# Patient Record
Sex: Male | Born: 1965
Health system: Southern US, Community
[De-identification: ages and names within clinical notes are randomized; demographics above are authoritative.]

## PROBLEM LIST (undated history)

## (undated) DIAGNOSIS — T7840XA Allergy, unspecified, initial encounter: Secondary | ICD-10-CM

## (undated) DIAGNOSIS — E785 Hyperlipidemia, unspecified: Secondary | ICD-10-CM

## (undated) DIAGNOSIS — N200 Calculus of kidney: Secondary | ICD-10-CM

## (undated) DIAGNOSIS — K219 Gastro-esophageal reflux disease without esophagitis: Secondary | ICD-10-CM

## (undated) DIAGNOSIS — E669 Obesity, unspecified: Secondary | ICD-10-CM

## (undated) DIAGNOSIS — J189 Pneumonia, unspecified organism: Secondary | ICD-10-CM

## (undated) DIAGNOSIS — L719 Rosacea, unspecified: Secondary | ICD-10-CM

## (undated) DIAGNOSIS — G43909 Migraine, unspecified, not intractable, without status migrainosus: Secondary | ICD-10-CM

## (undated) DIAGNOSIS — M722 Plantar fascial fibromatosis: Secondary | ICD-10-CM

## (undated) DIAGNOSIS — I10 Essential (primary) hypertension: Secondary | ICD-10-CM

## (undated) HISTORY — DX: Calculus of kidney: N20.0

## (undated) HISTORY — DX: Plantar fascial fibromatosis: M72.2

## (undated) HISTORY — DX: Rosacea, unspecified: L71.9

## (undated) HISTORY — PX: OTHER SURGICAL HISTORY: SHX169

## (undated) HISTORY — DX: Allergy, unspecified, initial encounter: T78.40XA

## (undated) HISTORY — DX: Obesity, unspecified: E66.9

## (undated) HISTORY — PX: WISDOM TOOTH EXTRACTION: SHX21

## (undated) HISTORY — DX: Migraine, unspecified, not intractable, without status migrainosus: G43.909

## (undated) HISTORY — DX: Hyperlipidemia, unspecified: E78.5

## (undated) HISTORY — PX: VASECTOMY: SHX75

---

## 2010-01-13 ENCOUNTER — Emergency Department (HOSPITAL_COMMUNITY): Admission: EM | Admit: 2010-01-13 | Discharge: 2010-01-13 | Payer: Self-pay | Admitting: Family Medicine

## 2010-11-27 ENCOUNTER — Other Ambulatory Visit: Payer: Self-pay | Admitting: Internal Medicine

## 2010-11-29 ENCOUNTER — Ambulatory Visit
Admission: RE | Admit: 2010-11-29 | Discharge: 2010-11-29 | Disposition: A | Discharge status: Home / Self Care | Payer: Commercial Managed Care - PPO | Source: Ambulatory Visit | Attending: Internal Medicine | Admitting: Internal Medicine

## 2012-08-20 ENCOUNTER — Emergency Department (HOSPITAL_COMMUNITY)
Admission: EM | Admit: 2012-08-20 | Discharge: 2012-08-20 | Disposition: A | Discharge status: Home / Self Care | Payer: 59 | Source: Home / Self Care | Attending: Family Medicine | Admitting: Family Medicine

## 2012-08-20 ENCOUNTER — Encounter (HOSPITAL_COMMUNITY): Payer: Self-pay | Admitting: Emergency Medicine

## 2012-08-20 DIAGNOSIS — H11429 Conjunctival edema, unspecified eye: Secondary | ICD-10-CM

## 2012-08-20 HISTORY — DX: Essential (primary) hypertension: I10

## 2012-08-20 MED ORDER — KETOROLAC TROMETHAMINE 0.5 % OP SOLN: 1.0000 [drp] | Freq: Three times a day (TID) | OPHTHALMIC | Status: DC | PRN

## 2012-08-20 MED ORDER — TETRACAINE HCL 0.5 % OP SOLN
OPHTHALMIC | Status: AC
  Filled 2012-08-20: qty 2

## 2012-08-20 MED ORDER — ERYTHROMYCIN 5 MG/GM OP OINT: TOPICAL_OINTMENT | OPHTHALMIC | Status: DC

## 2012-08-20 NOTE — ED Notes (Signed)
Pt c/o injury to right eye. Pt states that he was working on a car and something fell into his right eye. At that time he felt a sharp pain that came and went. Since then he has had red streaking under eye and ? Blister in corner of right eye.  There is some puffiness and drainage from right eye. Pain has subsided.   Incident happened three wks ago.

## 2012-08-20 NOTE — ED Notes (Signed)
Waiting discharge papers 

## 2012-08-24 NOTE — ED Provider Notes (Signed)
History     CSN: [id number]  Arrival date & time 08/20/12  4098   First MD Initiated Contact with Patient 08/20/12 1010      Chief Complaint  Patient presents with  . Eye Injury    (Consider location/radiation/quality/duration/timing/severity/associated sxs/prior treatment) HPI Comments: 46 y/o male with h/o hypertension comes c/o right eye drainage and a blister in corner of the eye. Denies eye pain, itchiness or visual changes. Patient reports he had a foreign body falling inside his right eye while working under a car 3 weeks ago. Felt a sharp pain at the time that quickly resolved, subsequently developed a red spot that looked like a clot and a blister in the inner corner of the eye that is still present. Denies current pain but reports intermittent thin clear discharge that is thicker in am. No pain with eye movement.    Past Medical History  Diagnosis Date  . Hypertension     History reviewed. No pertinent past surgical history.  History reviewed. No pertinent family history.  History  Substance Use Topics  . Smoking status: Never Smoker   . Smokeless tobacco: Not on file  . Alcohol Use: Yes     occasional      Review of Systems  Eyes: Positive for discharge and redness. Negative for photophobia, pain, itching and visual disturbance.       As per HPI  Neurological: Negative for headaches.  All other systems reviewed and are negative.    Allergies  Review of patient's allergies indicates no known allergies.  Home Medications   Current Outpatient Rx  Name Route Sig Dispense Refill  . BENICAR PO Oral Take 10 mg by mouth.    . ERYTHROMYCIN 5 MG/GM OP OINT  Place a 1/2 inch ribbon of ointment into the righ lower eyelid every 6 hours for 7 days 1 g 0  . KETOROLAC TROMETHAMINE 0.5 % OP SOLN Right Eye Place 1 drop into the right eye 3 (three) times daily as needed. 3 mL 0    There were no vitals taken for this visit.  Physical Exam  Nursing note and vitals  reviewed. Constitutional: He is oriented to person, place, and time. He appears well-developed and well-nourished. No distress.  HENT:  Head: Normocephalic and atraumatic.  Eyes: EOM are normal. Pupils are equal, round, and reactive to light. Right conjunctiva is injected. No scleral icterus. Right eye exhibits normal extraocular motion and no nystagmus.         Right eye:  Upper lid everted and observed normal inner conjunctiva and no foreign body. No obvious hyphema or hypopion Ocular globe with mild conjunctival injection. There is focal swelling and chemosis at the nasal corner of the conjunctiva. No ulcerations, pooling or obvious foreign body on fluorescein exam. Clear tearing.   Neck: Neck supple.  Cardiovascular: Normal heart sounds.   Pulmonary/Chest: Breath sounds normal.  Lymphadenopathy:    He has no cervical adenopathy.  Neurological: He is alert and oriented to person, place, and time.  Skin: He is not diaphoretic.    ED Course  Procedures (including critical care time)  Labs Reviewed - No data to display No results found.   1. Chemosis of conjunctiva subconjunctival edema       MDM  Prescribed erythromycin ointment and ketorolac drops. Asked to follow up with eye specialist to assure no deep embedded foreign body.  ophthalmologist contact information provided.         Sharin Grave, MD 08/24/12 [phone]

## 2014-07-21 ENCOUNTER — Ambulatory Visit (INDEPENDENT_AMBULATORY_CARE_PROVIDER_SITE_OTHER): Payer: Self-pay | Admitting: General Surgery

## 2014-09-01 NOTE — Progress Notes (Signed)
Please put orders in Epic surgery 09-16-14 pre op 09-12-14 Thanks

## 2014-09-02 ENCOUNTER — Other Ambulatory Visit (INDEPENDENT_AMBULATORY_CARE_PROVIDER_SITE_OTHER): Payer: Self-pay | Admitting: General Surgery

## 2014-09-09 ENCOUNTER — Other Ambulatory Visit (HOSPITAL_COMMUNITY): Payer: Self-pay | Admitting: *Deleted

## 2014-09-12 ENCOUNTER — Encounter (HOSPITAL_COMMUNITY): Payer: Self-pay

## 2014-09-12 ENCOUNTER — Encounter (HOSPITAL_COMMUNITY)
Admission: RE | Admit: 2014-09-12 | Discharge: 2014-09-12 | Disposition: A | Discharge status: Home / Self Care | Payer: BC Managed Care – PPO | Source: Ambulatory Visit | Attending: General Surgery | Admitting: General Surgery

## 2014-09-12 ENCOUNTER — Ambulatory Visit (HOSPITAL_COMMUNITY)
Admission: RE | Admit: 2014-09-12 | Discharge: 2014-09-12 | Disposition: A | Discharge status: Home / Self Care | Payer: BC Managed Care – PPO | Source: Ambulatory Visit | Attending: Anesthesiology | Admitting: Anesthesiology

## 2014-09-12 DIAGNOSIS — I10 Essential (primary) hypertension: Secondary | ICD-10-CM

## 2014-09-12 DIAGNOSIS — K469 Unspecified abdominal hernia without obstruction or gangrene: Secondary | ICD-10-CM | POA: Insufficient documentation

## 2014-09-12 DIAGNOSIS — Z01818 Encounter for other preprocedural examination: Secondary | ICD-10-CM | POA: Diagnosis present

## 2014-09-12 HISTORY — DX: Gastro-esophageal reflux disease without esophagitis: K21.9

## 2014-09-12 HISTORY — DX: Pneumonia, unspecified organism: J18.9

## 2014-09-12 LAB — COMPREHENSIVE METABOLIC PANEL
ALK PHOS: 85 U/L (ref 39–117)
ALT: 27 U/L (ref 0–53)
ANION GAP: 11 (ref 5–15)
AST: 35 U/L (ref 0–37)
Albumin: 4 g/dL (ref 3.5–5.2)
BUN: 16 mg/dL (ref 6–23)
CALCIUM: 9.3 mg/dL (ref 8.4–10.5)
CO2: 27 mEq/L (ref 19–32)
CREATININE: 0.87 mg/dL (ref 0.50–1.35)
Chloride: 104 mEq/L (ref 96–112)
GFR calc non Af Amer: >90 mL/min (ref >90)
GLUCOSE: 99 mg/dL (ref 70–99)
Potassium: 4.4 mEq/L (ref 3.7–5.3)
Sodium: 142 mEq/L (ref 137–147)
TOTAL PROTEIN: 7.6 g/dL (ref 6.0–8.3)
Total Bilirubin: 0.6 mg/dL (ref 0.3–1.2)

## 2014-09-12 LAB — CBC WITH DIFFERENTIAL/PLATELET
Basophils Absolute: 0 10*3/uL (ref 0.0–0.1)
Basophils Relative: 0 % (ref 0–1)
EOS ABS: 0.1 10*3/uL (ref 0.0–0.7)
EOS PCT: 1 % (ref 0–5)
HCT: 41.3 % (ref 39.0–52.0)
HEMOGLOBIN: 14.7 g/dL (ref 13.0–17.0)
Lymphocytes Relative: 23 % (ref 12–46)
Lymphs Abs: 1.6 10*3/uL (ref 0.7–4.0)
MCH: 32.7 pg (ref 26.0–34.0)
MCHC: 35.6 g/dL (ref 30.0–36.0)
MCV: 91.8 fL (ref 78.0–100.0)
MONO ABS: 0.4 10*3/uL (ref 0.1–1.0)
MONOS PCT: 6 % (ref 3–12)
Neutro Abs: 4.8 10*3/uL (ref 1.7–7.7)
Neutrophils Relative %: 70 % (ref 43–77)
Platelets: 187 10*3/uL (ref 150–400)
RBC: 4.5 MIL/uL (ref 4.22–5.81)
RDW: 12.3 % (ref 11.5–15.5)
WBC: 7 10*3/uL (ref 4.0–10.5)

## 2014-09-12 NOTE — Progress Notes (Signed)
EKG per chart from 11/27/2010 per Dr Keane Police office

## 2014-09-12 NOTE — Patient Instructions (Signed)
[place]  09/12/2014   Your procedure is scheduled on:     Friday November 20,2015  Report to [facility] Main Entrance and follow signs to  [place] at [phone] AM.   Call this number if you have problems the morning of surgery [phone] or Presurgical Testing [phone].   Remember:  Do not eat food or drink liquids :After Midnight.  For Living Will and/or Health Care Power Attorney Forms: please provide copy for your medical record, may bring AM of surgery (forms should be already notarized-we do not provide this service).      Take these medicines the morning of surgery with A SIP OF WATER: Loratadine(Claritin) if needed.                               You may not have any metal on your body including hair pins and piercings  Do not wear jewelry, lotions, powders, or deodorant.  Men may shave face and neck.               Do not bring valuables to the hospital. [place].  Contacts, dentures or bridgework may not be worn into surgery.  Leave suitcase in the car. After surgery it may be brought to your room.  For patients admitted to the hospital, checkout time is 11:00 AM the day of discharge.   ________________________________________________________________________  [facility] - Preparing for Surgery Before surgery, you can play an important role.  Because skin is not sterile, your skin needs to be as free of germs as possible.  You can reduce the number of germs on your skin by washing with CHG (chlorahexidine gluconate) soap before surgery.  CHG is an antiseptic cleaner which kills germs and bonds with the skin to continue killing germs even after washing. Please DO NOT use if you have an allergy to CHG or antibacterial soaps.  If your skin becomes reddened/irritated stop using the CHG and inform your nurse when you arrive at Short Stay. Do not shave (including legs and underarms) for at least 48 hours prior  to the first CHG shower.  You may shave your face/neck. Please follow these instructions carefully:  1.  Shower with CHG Soap the night before surgery and the  morning of Surgery.  2.  If you choose to wash your hair, wash your hair first as usual with your  normal  shampoo.  3.  After you shampoo, rinse your hair and body thoroughly to remove the  shampoo.                           4.  Use CHG as you would any other liquid soap.  You can apply chg directly  to the skin and wash                       Gently with a scrungie or clean washcloth.  5.  Apply the CHG Soap to your body ONLY FROM THE NECK DOWN.   Do not use on face/ open                           Wound or open sores. Avoid contact with eyes, ears mouth and genitals (private parts).  Wash face,  Genitals (private parts) with your normal soap.             6.  Wash thoroughly, paying special attention to the area where your surgery  will be performed.  7.  Thoroughly rinse your body with warm water from the neck down.  8.  DO NOT shower/wash with your normal soap after using and rinsing off  the CHG Soap.                9.  Pat yourself dry with a clean towel.            10.  Wear clean pajamas.            11.  Place clean sheets on your bed the night of your first shower and do not  sleep with pets. Day of Surgery : Do not apply any lotions/deodorants the morning of surgery.  Please wear clean clothes to the hospital/surgery center.  FAILURE TO FOLLOW THESE INSTRUCTIONS MAY RESULT IN THE CANCELLATION OF YOUR SURGERY PATIENT SIGNATURE_________________________________  NURSE SIGNATURE__________________________________  ________________________________________________________________________

## 2014-09-12 NOTE — Progress Notes (Signed)
Your patient has screened at an elevated risk for Obstructive Sleep Apnea using the Stop-Bang Tool during a pre-surgical vist. A score of 4 or greater is an elevated risk. Score of 5.  

## 2014-09-15 MED ORDER — DEXTROSE 5 % IV SOLN
3.0000 g | INTRAVENOUS | Status: AC
  Administered 2014-09-16: 3 g via INTRAVENOUS
  Filled 2014-09-15: qty 3000

## 2014-09-15 NOTE — Progress Notes (Signed)
Received phone call from pts wife stating Shuan worked outside over weekend and now has non productive cough, with no fever. States no color noted in nasal secretions.  States called dr Laqueta Linden office and was told to call here so could be documented for anesthesia.  Wife states she was sure PCP would not see him today.  Note under special  needs column of OR schedule.  Instructed wife to have patient notify anesthesia and surgeon in am

## 2014-09-15 NOTE — H&P (Signed)
Robert Mills 07/21/2014 9:03 AM Location: [facility] Patient #: [id number] DOB: 1966-04-28 Married / Language: Robert Mills / Race: White Male  History of Present Illness Robert Hiss [name] MD; 07/21/2014 9:43 AM) Patient words: umb. hernia.  The patient is a 48 year old male who presents with an umbilical hernia. 48 year old Caucasian male referred by Dr. Virgina Mills for evaluation of umbilical hernia. The patient states that he noticed a problem about 4-5 years ago after a coughing and sneezing spell. He felt a tug at that time. Over the years he states that the bulge has gotten larger. It does not interfere with his daily activities. However if he bumps it or if anybody touches the area it does cause a fair amount of pain and discomfort. He denies any fever, chills, nausea, vomiting, diarrhea or constipation. He is trying to lose some weight. He does not smoke. He denies any prior abdominal surgeries. He works as a Research scientist (medical) ([place], [place]; 07/21/2014 9:04 AM) Gastroesophageal Reflux Disease High blood pressure Migraine Headache  Past Surgical History ([place], [place]; 07/21/2014 9:04 AM) Vasectomy  Diagnostic Studies History ([place], [place]; 07/21/2014 9:04 AM) Colonoscopy never  Allergies (Robert Mills; 07/21/2014 9:06 AM) No Known Drug Allergies09/24/2015  Medication History (Robert Mills; 07/21/2014 9:06 AM) Benicar HCT (Oral) Specific dose unknown - Active.  Social History ([place], [place]; 07/21/2014 9:04 AM) Alcohol use Occasional alcohol use. Caffeine use Tea. No drug use Tobacco use Former smoker.  Family History ([place], [place]; 07/21/2014 9:04 AM) Diabetes Mellitus Mother. Melanoma Family Members In General.  Review of Systems (Robert Mills; 07/21/2014 9:04 AM) General Not Present- Appetite Loss, Chills, Fatigue, Fever, Night Sweats,  Weight Gain and Weight Loss. Skin Not Present- Change in Wart/Mole, Dryness, Hives, Jaundice, New Lesions, Non-Healing Wounds, Rash and Ulcer. HEENT Present- Seasonal Allergies and Wears glasses/contact lenses. Not Present- Earache, Hearing Loss, Hoarseness, Nose Bleed, Oral Ulcers, Ringing in the Ears, Sinus Pain, Sore Throat, Visual Disturbances and Yellow Eyes. Respiratory Present- Snoring. Not Present- Bloody sputum, Chronic Cough, Difficulty Breathing and Wheezing. Breast Not Present- Breast Mass, Breast Pain, Nipple Discharge and Skin Changes. Cardiovascular Not Present- Chest Pain, Difficulty Breathing Lying Down, Leg Cramps, Palpitations, Rapid Heart Rate, Shortness of Breath and Swelling of Extremities. Gastrointestinal Present- Indigestion. Not Present- Abdominal Pain, Bloating, Bloody Stool, Change in Bowel Habits, Chronic diarrhea, Constipation, Difficulty Swallowing, Excessive gas, Gets full quickly at meals, Hemorrhoids, Nausea, Rectal Pain and Vomiting. Male Genitourinary Present- Blood in Urine. Not Present- Change in Urinary Stream, Frequency, Impotence, Nocturia, Painful Urination, Urgency and Urine Leakage. Musculoskeletal Present- Joint Pain and Joint Stiffness. Not Present- Back Pain, Muscle Pain, Muscle Weakness and Swelling of Extremities. Neurological Present- Headaches. Not Present- Decreased Memory, Fainting, Numbness, Seizures, Tingling, Tremor, Trouble walking and Weakness. Psychiatric Not Present- Anxiety, Bipolar, Change in Sleep Pattern, Depression, Fearful and Frequent crying. Endocrine Not Present- Cold Intolerance, Excessive Hunger, Hair Changes, Heat Intolerance, Hot flashes and New Diabetes. Hematology Not Present- Easy Bruising, Excessive bleeding, Gland problems, HIV and Persistent Infections.   Vitals (Robert Mills; 07/21/2014 9:06 AM) 07/21/2014 9:05 AM Weight: 263.8 lb Height: 73in Body Surface Area: 2.48 m Body Mass Index: 34.8  kg/m Temp.: 98.80F(Oral)  Pulse: 81 (Regular)  P.OX: 98% (Room air) BP: 130/84 (Sitting, Left Arm, Standard)    Physical Exam Robert Hiss M. Prinston Kynard MD; 07/21/2014 9:45 AM) General Mental Status-Alert. General Appearance-Consistent with stated age. Hydration-Well hydrated. Voice-Normal.  Head and Neck Head-normocephalic, atraumatic  with no lesions or palpable masses. Trachea-midline. Thyroid Gland Characteristics - normal size and consistency.  Eye Eyeball - Bilateral-Extraocular movements intact. Sclera/Conjunctiva - Bilateral-No scleral icterus.  Chest and Lung Exam Chest and lung exam reveals -quiet, even and easy respiratory effort with no use of accessory muscles, normal resonance, no flatness or dullness and on auscultation, normal breath sounds, no adventitious sounds and normal vocal resonance. Inspection Chest Wall - Normal. Back - normal.  Breast - Did not examine.  Cardiovascular Cardiovascular examination reveals -normal heart sounds, regular rate and rhythm with no murmurs and carotid auscultation reveals no bruits.  Abdomen Inspection Skin - Scar - no surgical scars. Hernias - Diastasis recti - Absent. Umbilical hernia - Reducible. Note: SMALL BULGE AT UMBILICUS, REDUNDANT SKIN, REDUCIBLE, DEFECT AT LEAST 2 CM, SOME TTP ON DEEP PALPATION. Palpation/Percussion Palpation and Percussion of the abdomen reveal - Soft, Non Tender, No Rebound tenderness, No Rigidity (guarding) and No hepatosplenomegaly. Auscultation Auscultation of the abdomen reveals - Bowel sounds normal.  Rectal - Did not examine.  Peripheral Vascular Upper Extremity Inspection - Bilateral - Normal - No Clubbing, No Cyanosis, No Edema, Pulses Intact. Palpation - Pulses bilaterally normal. Lower Extremity Palpation - Pulses bilaterally normal.  Neurologic Neurologic evaluation reveals -alert and oriented x 3 with no impairment of recent or remote memory. Mental  Status-Normal.  Musculoskeletal Normal Exam - Left-Upper Extremity Strength Normal and Lower Extremity Strength Normal. Normal Exam - Right-Upper Extremity Strength Normal and Lower Extremity Strength Normal.  Lymphatic Head & Neck  General Head & Neck Lymphatics: Bilateral - Description - Normal. Axillary - Did not examine. Femoral & Inguinal  Generalized Femoral & Inguinal Lymphatics: Bilateral - Description - Normal. Tenderness - Non Tender.    Assessment & Plan Robert Hiss M. Andray Assefa MD; 5/36/6440 3:47 AM) UMBILICAL HERNIA WITHOUT OBSTRUCTION AND WITHOUT GANGRENE (553.1  K42.9) Impression: We discussed the etiology of ventral umbilical hernias. We discussed the signs and symptoms of incarceration and strangulation. The patient was given educational material. I also drew diagrams.  We discussed nonoperative and operative management. With respect to operative management, we discussed both open repair and laparoscopic assisted repair. We discussed the pros and cons of each approach. I discussed the typical aftercare with each procedure and how each procedure differs.  The patient has elected to proceed [place] (goal will be to bring fascia back together)  We discussed the risk and benefits of surgery including but not limited to bleeding, infection, injury to surrounding structures, hernia recurrence, mesh complications, hematoma/seroma formation, need to convert to an open procedure, blood clot formation, urinary retention, post operative ileus, general anesthesia risk, long-term abdominal pain. We discussed that this procedure can be quite uncomfortable and difficult to recover from based on how the mesh is secured to the abdominal wall. We discussed the importance of avoiding heavy lifting and straining for a period of 4-6 weeks. Current Plans  Schedule for Surgery Discussed regular exercise with patient. Instructions: our schedulers will  contact you schedule surgery in near future. in interim, please work on decreasing daily calories (sweet tea) and exercise Provided with written educational materials OBESITY (BMI 30.0-34.9) (278.00  E66.9) Impression: We discussed the importance of diet and exercise with respect to overall health as well as decreasing his risk of hernia recurrence. We discussed one simple area for him to work on would be the elimination of sweet tea from his diet. We discussed the importance of proper food choices as well as some  daily form of cardiovascular activity in addition to some strength training  Leighton Ruff. Redmond Pulling, MD, FACS General, Bariatric, & Minimally Invasive Surgery [facility], [place]

## 2014-09-16 ENCOUNTER — Ambulatory Visit (HOSPITAL_COMMUNITY): Payer: BC Managed Care – PPO | Admitting: Anesthesiology

## 2014-09-16 ENCOUNTER — Encounter (HOSPITAL_COMMUNITY): Payer: Self-pay

## 2014-09-16 ENCOUNTER — Ambulatory Visit (HOSPITAL_COMMUNITY)
Admission: RE | Admit: 2014-09-16 | Discharge: 2014-09-16 | Disposition: A | Discharge status: Home / Self Care | Payer: BC Managed Care – PPO | Source: Ambulatory Visit | Attending: General Surgery | Admitting: General Surgery

## 2014-09-16 ENCOUNTER — Encounter (HOSPITAL_COMMUNITY)
Admission: RE | Disposition: A | Discharge status: Home / Self Care | Payer: Self-pay | Source: Ambulatory Visit | Attending: General Surgery

## 2014-09-16 DIAGNOSIS — I10 Essential (primary) hypertension: Secondary | ICD-10-CM | POA: Insufficient documentation

## 2014-09-16 DIAGNOSIS — K429 Umbilical hernia without obstruction or gangrene: Secondary | ICD-10-CM | POA: Diagnosis not present

## 2014-09-16 DIAGNOSIS — Z6834 Body mass index (BMI) 34.0-34.9, adult: Secondary | ICD-10-CM | POA: Diagnosis not present

## 2014-09-16 DIAGNOSIS — Z87891 Personal history of nicotine dependence: Secondary | ICD-10-CM | POA: Diagnosis not present

## 2014-09-16 DIAGNOSIS — K219 Gastro-esophageal reflux disease without esophagitis: Secondary | ICD-10-CM | POA: Diagnosis not present

## 2014-09-16 DIAGNOSIS — E669 Obesity, unspecified: Secondary | ICD-10-CM | POA: Insufficient documentation

## 2014-09-16 DIAGNOSIS — G43909 Migraine, unspecified, not intractable, without status migrainosus: Secondary | ICD-10-CM | POA: Insufficient documentation

## 2014-09-16 HISTORY — PX: INSERTION OF MESH: SHX5868

## 2014-09-16 HISTORY — PX: UMBILICAL HERNIA REPAIR: SHX196

## 2014-09-16 SURGERY — REPAIR, HERNIA, UMBILICAL, LAPAROSCOPIC
Anesthesia: General | Site: Abdomen

## 2014-09-16 MED ORDER — KETOROLAC TROMETHAMINE 30 MG/ML IJ SOLN
INTRAMUSCULAR | Status: AC
  Filled 2014-09-16: qty 1

## 2014-09-16 MED ORDER — FENTANYL CITRATE 0.05 MG/ML IJ SOLN
INTRAMUSCULAR | Status: DC | PRN
  Administered 2014-09-16: 100 ug via INTRAVENOUS
  Administered 2014-09-16: 50 ug via INTRAVENOUS

## 2014-09-16 MED ORDER — PROMETHAZINE HCL 25 MG/ML IJ SOLN: 6.2500 mg | INTRAMUSCULAR | Status: DC | PRN

## 2014-09-16 MED ORDER — NEOSTIGMINE METHYLSULFATE 10 MG/10ML IV SOLN
INTRAVENOUS | Status: DC | PRN
  Administered 2014-09-16: 5 mg via INTRAVENOUS

## 2014-09-16 MED ORDER — PROPOFOL 10 MG/ML IV BOLUS
INTRAVENOUS | Status: DC | PRN
  Administered 2014-09-16: 200 mg via INTRAVENOUS

## 2014-09-16 MED ORDER — OXYCODONE-ACETAMINOPHEN 5-325 MG PO TABS: 1.0000 | ORAL_TABLET | ORAL | Status: DC | PRN

## 2014-09-16 MED ORDER — SODIUM CHLORIDE 0.9 % IJ SOLN
INTRAMUSCULAR | Status: AC
  Filled 2014-09-16: qty 10

## 2014-09-16 MED ORDER — GLYCOPYRROLATE 0.2 MG/ML IJ SOLN
INTRAMUSCULAR | Status: DC | PRN
  Administered 2014-09-16: .8 mg via INTRAVENOUS

## 2014-09-16 MED ORDER — LACTATED RINGERS IV SOLN
INTRAVENOUS | Status: DC | PRN
  Administered 2014-09-16: 07:00:00 via INTRAVENOUS
  Administered 2014-09-16: 1000 mL

## 2014-09-16 MED ORDER — EPHEDRINE SULFATE 50 MG/ML IJ SOLN
INTRAMUSCULAR | Status: AC
  Filled 2014-09-16: qty 1

## 2014-09-16 MED ORDER — DIPHENHYDRAMINE HCL 50 MG/ML IJ SOLN
INTRAMUSCULAR | Status: AC
  Filled 2014-09-16: qty 1

## 2014-09-16 MED ORDER — KETOROLAC TROMETHAMINE 30 MG/ML IJ SOLN: 15.0000 mg | Freq: Once | INTRAMUSCULAR | Status: DC | PRN

## 2014-09-16 MED ORDER — SODIUM CHLORIDE 0.9 % IV SOLN: 250.0000 mL | INTRAVENOUS | Status: DC | PRN

## 2014-09-16 MED ORDER — EPHEDRINE SULFATE 50 MG/ML IJ SOLN
INTRAMUSCULAR | Status: DC | PRN
  Administered 2014-09-16 (×2): 5 mg via INTRAVENOUS

## 2014-09-16 MED ORDER — PROPOFOL 10 MG/ML IV BOLUS
INTRAVENOUS | Status: AC
  Filled 2014-09-16: qty 20

## 2014-09-16 MED ORDER — SODIUM CHLORIDE 0.9 % IJ SOLN: 3.0000 mL | INTRAMUSCULAR | Status: DC | PRN

## 2014-09-16 MED ORDER — CHLORHEXIDINE GLUCONATE 4 % EX LIQD: 1.0000 "application " | Freq: Once | CUTANEOUS | Status: DC

## 2014-09-16 MED ORDER — HYDROMORPHONE HCL 1 MG/ML IJ SOLN: 0.2500 mg | INTRAMUSCULAR | Status: DC | PRN

## 2014-09-16 MED ORDER — ROCURONIUM BROMIDE 100 MG/10ML IV SOLN
INTRAVENOUS | Status: DC | PRN
  Administered 2014-09-16: 5 mg via INTRAVENOUS
  Administered 2014-09-16: 50 mg via INTRAVENOUS

## 2014-09-16 MED ORDER — FENTANYL CITRATE 0.05 MG/ML IJ SOLN
INTRAMUSCULAR | Status: AC
  Filled 2014-09-16: qty 5

## 2014-09-16 MED ORDER — GLYCOPYRROLATE 0.2 MG/ML IJ SOLN
INTRAMUSCULAR | Status: AC
  Filled 2014-09-16: qty 4

## 2014-09-16 MED ORDER — MORPHINE SULFATE 10 MG/ML IJ SOLN: 1.0000 mg | INTRAMUSCULAR | Status: DC | PRN

## 2014-09-16 MED ORDER — OXYCODONE HCL 5 MG PO TABS: 5.0000 mg | ORAL_TABLET | ORAL | Status: DC | PRN

## 2014-09-16 MED ORDER — SODIUM CHLORIDE 0.9 % IJ SOLN: 3.0000 mL | Freq: Two times a day (BID) | INTRAMUSCULAR | Status: DC

## 2014-09-16 MED ORDER — SODIUM CHLORIDE 0.9 % IJ SOLN
INTRAMUSCULAR | Status: AC
  Filled 2014-09-16: qty 20

## 2014-09-16 MED ORDER — LIDOCAINE HCL (CARDIAC) 20 MG/ML IV SOLN
INTRAVENOUS | Status: DC | PRN
  Administered 2014-09-16: 100 mg via INTRAVENOUS

## 2014-09-16 MED ORDER — BUPIVACAINE-EPINEPHRINE 0.25% -1:200000 IJ SOLN
INTRAMUSCULAR | Status: AC
  Filled 2014-09-16: qty 1

## 2014-09-16 MED ORDER — KETOROLAC TROMETHAMINE 30 MG/ML IJ SOLN: 30.0000 mg | Freq: Four times a day (QID) | INTRAMUSCULAR | Status: DC

## 2014-09-16 MED ORDER — ACETAMINOPHEN 650 MG RE SUPP
650.0000 mg | RECTAL | Status: DC | PRN
  Filled 2014-09-16: qty 1

## 2014-09-16 MED ORDER — DEXAMETHASONE SODIUM PHOSPHATE 10 MG/ML IJ SOLN
INTRAMUSCULAR | Status: AC
  Filled 2014-09-16: qty 1

## 2014-09-16 MED ORDER — DIPHENHYDRAMINE HCL 50 MG/ML IJ SOLN
INTRAMUSCULAR | Status: DC | PRN
  Administered 2014-09-16 (×2): 12.5 mg via INTRAVENOUS

## 2014-09-16 MED ORDER — LIDOCAINE HCL (CARDIAC) 20 MG/ML IV SOLN
INTRAVENOUS | Status: AC
  Filled 2014-09-16: qty 5

## 2014-09-16 MED ORDER — SODIUM CHLORIDE 0.9 % IV SOLN
INTRAVENOUS | Status: DC | PRN
  Administered 2014-09-16: 10 mL via INTRAMUSCULAR

## 2014-09-16 MED ORDER — ONDANSETRON HCL 4 MG/2ML IJ SOLN
INTRAMUSCULAR | Status: AC
  Filled 2014-09-16: qty 2

## 2014-09-16 MED ORDER — ROCURONIUM BROMIDE 100 MG/10ML IV SOLN
INTRAVENOUS | Status: AC
  Filled 2014-09-16: qty 1

## 2014-09-16 MED ORDER — BUPIVACAINE LIPOSOME 1.3 % IJ SUSP
20.0000 mL | Freq: Once | INTRAMUSCULAR | Status: AC
  Administered 2014-09-16: 20 mL
  Filled 2014-09-16: qty 20

## 2014-09-16 MED ORDER — MIDAZOLAM HCL 5 MG/5ML IJ SOLN
INTRAMUSCULAR | Status: DC | PRN
  Administered 2014-09-16: 2 mg via INTRAVENOUS

## 2014-09-16 MED ORDER — KETOROLAC TROMETHAMINE 30 MG/ML IJ SOLN
INTRAMUSCULAR | Status: DC | PRN
  Administered 2014-09-16: 30 mg via INTRAVENOUS

## 2014-09-16 MED ORDER — DEXAMETHASONE SODIUM PHOSPHATE 10 MG/ML IJ SOLN
INTRAMUSCULAR | Status: DC | PRN
  Administered 2014-09-16: 10 mg via INTRAVENOUS

## 2014-09-16 MED ORDER — ACETAMINOPHEN 10 MG/ML IV SOLN
1000.0000 mg | Freq: Once | INTRAVENOUS | Status: AC
  Administered 2014-09-16: 1000 mg via INTRAVENOUS
  Filled 2014-09-16: qty 100

## 2014-09-16 MED ORDER — BUPIVACAINE-EPINEPHRINE 0.25% -1:200000 IJ SOLN
INTRAMUSCULAR | Status: DC | PRN
  Administered 2014-09-16: 18 mL
  Administered 2014-09-16: 2 mL

## 2014-09-16 MED ORDER — NEOSTIGMINE METHYLSULFATE 10 MG/10ML IV SOLN
INTRAVENOUS | Status: AC
  Filled 2014-09-16: qty 1

## 2014-09-16 MED ORDER — ONDANSETRON HCL 4 MG/2ML IJ SOLN
INTRAMUSCULAR | Status: DC | PRN
  Administered 2014-09-16: 4 mg via INTRAVENOUS

## 2014-09-16 MED ORDER — MIDAZOLAM HCL 2 MG/2ML IJ SOLN
INTRAMUSCULAR | Status: AC
  Filled 2014-09-16: qty 2

## 2014-09-16 MED ORDER — ACETAMINOPHEN 325 MG PO TABS: 650.0000 mg | ORAL_TABLET | ORAL | Status: DC | PRN

## 2014-09-16 SURGICAL SUPPLY — 39 items
APPLIER CLIP LOGIC TI 5 (MISCELLANEOUS) ×3 IMPLANT
BINDER ABDOMINAL 12 ML 46-62 (SOFTGOODS) ×3 IMPLANT
CABLE HIGH FREQUENCY MONO STRZ (ELECTRODE) IMPLANT
CANISTER SUCTION 2500CC (MISCELLANEOUS) ×3 IMPLANT
CHLORAPREP W/TINT 26ML (MISCELLANEOUS) ×3 IMPLANT
DECANTER SPIKE VIAL GLASS SM (MISCELLANEOUS) ×3 IMPLANT
DEVICE SECURE STRAP 25 ABSORB (INSTRUMENTS) ×3 IMPLANT
DEVICE TROCAR PUNCTURE CLOSURE (ENDOMECHANICALS) ×3 IMPLANT
DRAPE INCISE IOBAN 66X45 STRL (DRAPES) IMPLANT
DRAPE LAPAROSCOPIC ABDOMINAL (DRAPES) ×3 IMPLANT
DRAPE UTILITY XL STRL (DRAPES) ×3 IMPLANT
ELECT REM PT RETURN 9FT ADLT (ELECTROSURGICAL) ×3
ELECTRODE REM PT RTRN 9FT ADLT (ELECTROSURGICAL) ×2 IMPLANT
GLOVE BIOGEL M STRL SZ7.5 (GLOVE) ×3 IMPLANT
GOWN STRL REUS W/TWL LRG LVL3 (GOWN DISPOSABLE) ×6 IMPLANT
GOWN STRL REUS W/TWL XL LVL3 (GOWN DISPOSABLE) ×6 IMPLANT
KIT BASIN OR (CUSTOM PROCEDURE TRAY) ×3 IMPLANT
LIQUID BAND (GAUZE/BANDAGES/DRESSINGS) ×3 IMPLANT
MARKER SKIN DUAL TIP RULER LAB (MISCELLANEOUS) ×3 IMPLANT
MESH VENTRALIGHT ST 4.5IN (Mesh General) ×3 IMPLANT
NEEDLE SPNL 22GX3.5 QUINCKE BK (NEEDLE) ×3 IMPLANT
NS IRRIG 1000ML POUR BTL (IV SOLUTION) ×3 IMPLANT
SCISSORS LAP 5X35 DISP (ENDOMECHANICALS) IMPLANT
SET IRRIG TUBING LAPAROSCOPIC (IRRIGATION / IRRIGATOR) IMPLANT
SHEARS HARMONIC ACE PLUS 36CM (ENDOMECHANICALS) IMPLANT
SLEEVE ENDOPATH XCEL 5M (ENDOMECHANICALS) ×3 IMPLANT
SLEEVE XCEL OPT CAN 5 100 (ENDOMECHANICALS) ×3 IMPLANT
SOLUTION ANTI FOG 6CC (MISCELLANEOUS) IMPLANT
STAPLER VISISTAT 35W (STAPLE) IMPLANT
SUT MNCRL AB 4-0 PS2 18 (SUTURE) ×3 IMPLANT
SUT NOVA NAB GS-21 0 18 T12 DT (SUTURE) ×6 IMPLANT
SUT VIC AB 3-0 SH 18 (SUTURE) ×3 IMPLANT
TOWEL OR 17X26 10 PK STRL BLUE (TOWEL DISPOSABLE) ×3 IMPLANT
TRAY FOLEY CATH 14FRSI W/METER (CATHETERS) IMPLANT
TRAY LAPAROSCOPIC (CUSTOM PROCEDURE TRAY) ×3 IMPLANT
TROCAR BLADELESS OPT 5 75 (ENDOMECHANICALS) IMPLANT
TROCAR XCEL BLUNT TIP 100MML (ENDOMECHANICALS) IMPLANT
TROCAR XCEL NON-BLD 11X100MML (ENDOMECHANICALS) ×3 IMPLANT
TUBING INSUFFLATION 10FT LAP (TUBING) ×3 IMPLANT

## 2014-09-16 NOTE — Discharge Instructions (Signed)
[facility]  UMBILICAL OR INGUINAL HERNIA REPAIR: POST OP INSTRUCTIONS  Always review your discharge instruction sheet given to you by the facility where your surgery was performed. IF YOU HAVE DISABILITY OR FAMILY LEAVE FORMS, YOU MUST BRING THEM TO THE OFFICE FOR PROCESSING.   DO NOT GIVE THEM TO YOUR DOCTOR.  TAKE BENADRYL AS NEEDED FOR ITCHING, RASH - FOLLOW PACKAGE DIRECTIONS  1. A  prescription for pain medication may be given to you upon discharge.  Take your pain medication as prescribed, if needed.  If narcotic pain medicine is not needed, then you may take acetaminophen (Tylenol) or ibuprofen (Advil) as needed. 2. Take your usually prescribed medications unless otherwise directed. 3. If you need a refill on your pain medication, please contact your pharmacy.  They will contact our office to request authorization. Prescriptions will not be filled after 5 pm or on week-ends. 4. You should follow a light diet the first 24 hours after arrival home, such as soup and crackers, etc.  Be sure to include lots of fluids daily.  Resume your normal diet the day after surgery. 5. Most patients will experience some swelling and bruising around the umbilicus or in the groin and scrotum.  Ice packs and reclining will help.  Swelling and bruising can take several days to resolve.  6. It is common to experience some constipation if taking pain medication after surgery.  Increasing fluid intake and taking a stool softener (such as Colace) will usually help or prevent this problem from occurring.  A mild laxative (Milk of Magnesia or Miralax) should be taken according to package directions if there are no bowel movements after 48 hours. 7.  If your surgeon used skin glue on the incision, you may shower in 24 hours.  The glue will flake off over the next 2-3 weeks.  Any sutures or staples will be removed at the office during your follow-up visit. 8. ACTIVITIES:  You may resume regular  (light) daily activities beginning the next day--such as daily self-care, walking, climbing stairs--gradually increasing activities as tolerated.  You may have sexual intercourse when it is comfortable.  Refrain from any heavy lifting or straining until approved by your doctor. a. You may drive when you are no longer taking prescription pain medication, you can comfortably wear a seatbelt, and you can safely maneuver your car and apply brakes. b. RETURN TO WORK:  9. You should see your doctor in the office for a follow-up appointment approximately 2-3 weeks after your surgery.  Make sure that you call for this appointment within a day or two after you arrive home to insure a convenient appointment time. 10. OTHER INSTRUCTIONS: DO NOT LIFT, PUSH, OR PULL ANYTHING GREATER THAN 15 POUNDS FOR 4 WEEKS 11. WEAR ABDOMINAL BINDER AS NEEDED FOR COMFORT    WHEN TO CALL YOUR DOCTOR: 1. Fever over 101.0 2. Inability to urinate 3. Nausea and/or vomiting 4. Extreme swelling or bruising 5. Continued bleeding from incision. 6. Increased pain, redness, or drainage from the incision  The clinic staff is available to answer your questions during regular business hours.  Please dont hesitate to call and ask to speak to one of the nurses for clinical concerns.  If you have a medical emergency, go to the nearest emergency room or call 911.  A surgeon from [facility] is always on call at the hospital   [address], [place], [place], [place]  [address] ?  [address]  [place], [place]   [address] [phone] ? [phone] ? FAX [phone] Web site: [email or web address]  

## 2014-09-16 NOTE — Progress Notes (Signed)
No rash noted upon arrival to [facility]. No c/o itching. Eyes slightly puffy

## 2014-09-16 NOTE — Progress Notes (Signed)
PACU note----pt has rash over trunk of body and upper legs; also note eyelids and under eyes slightly reddened and swollen; Dr. Kalman Shan in to check pt; additional benadryl given per order; pt denies shortness of breath or difficulty swallowing

## 2014-09-16 NOTE — Progress Notes (Signed)
Returned from [employer]. Ambulated well. A little light headedness, which he stated was present prior to surgery due to nasal congestion/ cold. Able to cough and support abd well

## 2014-09-16 NOTE — Transfer of Care (Signed)
Immediate Anesthesia Transfer of Care Note  Patient: Robert Mills  Procedure(s) Performed: Procedure(s): LAP ASSISTED UMBILICAL HERNIA REPAIR WITH MESH (N/A) INSERTION OF MESH (N/A)  Patient Location: PACU  Anesthesia Type:General  Level of Consciousness: awake, alert  and oriented  Airway & Oxygen Therapy: Patient Spontanous Breathing and Patient connected to face mask oxygen  Post-op Assessment: Report given to PACU RN and Post -op Vital signs reviewed and stable  Post vital signs: Reviewed and stable  Complications: No apparent anesthesia complications

## 2014-09-16 NOTE — Op Note (Signed)
LAVAL CAFARO [id number] 1965-12-23 09/16/2014   Laparoscopic assisted Umbilical Herniorrhaphy with mesh Procedure Note  Indications: Symptomatic umbilical hernia   Pre-operative Diagnosis: umbilical hernia   Post-operative Diagnosis: umbilical hernia   Surgeon: Leighton Ruff. Redmond Pulling, MD FACS   Assistants: none   Anesthesia: General endotracheal anesthesia and Local anesthesia 0.25%marcaine with epi + 40cc exparel  ASA Class: 2  Procedure Details  The patient was seen in the Holding Room. The risks, benefits, complications, treatment options, and expected outcomes were discussed with the patient. The possibilities of reaction to medication, pulmonary aspiration, perforation of viscus, bleeding, recurrent infection, hernia recurrence, the need for additional procedures, failure to diagnose a condition, and creating a complication requiring transfusion or operation were discussed with the patient. The patient concurred with the proposed plan, giving informed consent. The site of surgery properly noted/marked.   The patient was taken to Operating Room # 1 at [facility], identified as Judie Grieve and the procedure verified as Lap Assisted Umbilical Herniorrhaphy. A Time Out was held and the above information confirmed. The patient received IV antibiotics and IV tylenol prior to the procedure.   After establishing general anesthesia, the abdomen was prepped and draped in standard fashion. A 5 mm Optiview was used the cannulate the peritoneal cavity in the left upper quadrant below the costal margin.  Pneumoperitoneum was obtained by insufflating CO2, maintaining a maximum pressure of 15 mmHg.  The 5 mm 30-degree laparoscopic was inserted.  There were no significant omental adhesions to the anterior abdominal wall in and around the hernia defect. He had a small amount of preperitoneal fat in the defect. At this point I release pneumoperitoneum,   A curvilinear infraumbilical incision was created.  Dissection was carried down to the hernia sac located above the fascia and was mobilized from surrounding structures. Intact fascia was identified circumferentially around the defect. Skin and soft tissue was mobilized from the surface of the fascia in a circumferential manner. A finger sweep was performed underneath the fascia to ensure there were no adhesions to the anterior abdominal wall. The defect was 1.8cm. The plug of preperitoneal fat was removed with electrocautery. I obtained a round 4 inch piece of Bard VentralightST mesh and placed a 0-novafil suture thru the center of the mesh and tied it. Then the mesh was then placed thru the defect.   The fascia was then closed primarily over the mesh with 4 interrupted 0-novafil sutures. Pneumoperitoneum was reestablished. Another 5-mm port was placed in the left lower quadrant.   The stay suture was then pulled up through umbilical fascia around the primary closure using the Endo-close device.  This deployed the mesh widely over the fascial defect    The Secure Strap device was then used to tack down the edges of the mesh at 1 cm intervals circumferentially.  We placed a few tacks inside the outer ring of tacks.  We inspected for hemostasis. 40 cc exparel was injected into the preperitoneal space around the umbilicus and mesh. Pneumoperitoneum was released.   The umbilical incision was irrigated and additional exparel was infiltrated in the subcutaneous tissue and fascia. The umbilical stalk was then tacked back down to the fascia with two 3-0 vicryl sutures. Hemostasis was confirmed. The soft tissue was irrigated and closed in layers with inverted interrupted 3-0 vicryl sutures for the deep dermis. The skin incisions were closed with a 4-0 monocryl subcuticular closure. Liquidband exceed was used to seal the skin.   Instrument, sponge, and  needle counts were correct prior to closure and at the conclusion of the case.   Findings:  A 1.8 cm fascial defect   Type of repair - primary with mesh underlay  (choices - primary suture, mesh, or component)  Name of mesh - bard ventralightST  Size of mesh - 4in round  Mesh overlap - 6 cm  Placement of mesh - beneath fascia and into peritoneal cavity  (choices - beneath fascia and into peritoneal cavity, beneath fascia but external to peritoneal cavity, between the muscle and fascia, above or external to fascia)   Estimated Blood Loss: Minimal   Drains: none   Implants: see above   Complications: None; patient tolerated the procedure well. He developed a rash at end of procedure. We are monitoring his airway. He was given IV benadryl  Disposition: PACU - hemodynamically stable.   Condition: stable  Leighton Ruff. Redmond Pulling, MD, FACS General, Bariatric, & Minimally Invasive Surgery [facility], [place]

## 2014-09-16 NOTE — Anesthesia Postprocedure Evaluation (Signed)
  Anesthesia Post-op Note  Patient: Robert Mills  Procedure(s) Performed: Procedure(s) (LRB): LAP ASSISTED UMBILICAL HERNIA REPAIR WITH MESH (N/A) INSERTION OF MESH (N/A)  Patient Location: PACU  Anesthesia Type: General  Level of Consciousness: awake and alert   Airway and Oxygen Therapy: Patient Spontanous Breathing  Post-op Pain: mild  Post-op Assessment: Post-op Vital signs reviewed, Patient's Cardiovascular Status Stable, Respiratory Function Stable, Patent Airway and No signs of Nausea or vomiting  Last Vitals:  Filed Vitals:   09/16/14 0945  BP: 140/73  Pulse: 77  Temp: 36.7 C  Resp: 15    Post-op Vital Signs: stable   Complications: No apparent anesthesia complications

## 2014-09-16 NOTE — Progress Notes (Signed)
PACU note----no increase in pt's rash; eyes less reddened and less swollen

## 2014-09-16 NOTE — Progress Notes (Signed)
Patient has been congested w a head and chest cold. A little cough, but noncolored sputum , at times. Afebrile. States he feels better today than a week ago. Has not seen his PCP

## 2014-09-16 NOTE — Progress Notes (Signed)
  No further rash, just a little puffiness in eyes remain. Call to Dr Redmond Pulling to give update on patient status. Ok w DC to home at present

## 2014-09-16 NOTE — Anesthesia Preprocedure Evaluation (Addendum)
Anesthesia Evaluation  Patient identified by MRN, date of birth, ID band Patient awake    Reviewed: Allergy & Precautions, H&P , NPO status , Patient's Chart, lab work & pertinent test results  Airway Mallampati: II  TM Distance: <3 FB Neck ROM: Full    Dental no notable dental hx.    Pulmonary neg pulmonary ROS, former smoker,  breath sounds clear to auscultation  Pulmonary exam normal       Cardiovascular hypertension, Pt. on medications Rhythm:Regular Rate:Normal     Neuro/Psych negative neurological ROS  negative psych ROS   GI/Hepatic negative GI ROS, Neg liver ROS,   Endo/Other  Morbid obesity  Renal/GU negative Renal ROS  negative genitourinary   Musculoskeletal negative musculoskeletal ROS (+)   Abdominal   Peds negative pediatric ROS (+)  Hematology negative hematology ROS (+)   Anesthesia Other Findings   Reproductive/Obstetrics negative OB ROS                            Anesthesia Physical Anesthesia Plan  ASA: II  Anesthesia Plan: General   Post-op Pain Management:    Induction: Intravenous  Airway Management Planned: Oral ETT  Additional Equipment:   Intra-op Plan:   Post-operative Plan: Extubation in OR  Informed Consent: I have reviewed the patients History and Physical, chart, labs and discussed the procedure including the risks, benefits and alternatives for the proposed anesthesia with the patient or authorized representative who has indicated his/her understanding and acceptance.   Dental advisory given  Plan Discussed with: CRNA and Surgeon  Anesthesia Plan Comments:         Anesthesia Quick Evaluation

## 2014-09-16 NOTE — Interval H&P Note (Signed)
History and Physical Interval Note:  09/16/2014 7:25 AM  Judie Grieve  has presented today for surgery, with the diagnosis of Umbilical Hernia  The various methods of treatment have been discussed with the patient and family. After consideration of risks, benefits and other options for treatment, the patient has consented to  Procedure(s): LAP ASSISTED UMBILICAL HERNIA REPAIR WITH MESH (N/A) INSERTION OF MESH (N/A) as a surgical intervention .  The patient's history has been reviewed, patient examined, no change in status, stable for surgery.  I have reviewed the patient's chart and labs.  Questions were answered to the patient's satisfaction.    Leighton Ruff. Redmond Pulling, MD, [place], Bariatric, & Minimally Invasive Surgery [facility], [place]  [facility]

## 2014-09-16 NOTE — Progress Notes (Signed)
PACU note----Dr. Kalman Shan in to check pt; OK for pt to go to short stay when pt's criteria

## 2014-09-19 ENCOUNTER — Encounter (HOSPITAL_COMMUNITY): Payer: Self-pay | Admitting: General Surgery

## 2015-10-03 LAB — IFOBT (OCCULT BLOOD): IFOBT: POSITIVE

## 2015-10-06 ENCOUNTER — Encounter: Payer: Self-pay | Admitting: Gastroenterology

## 2015-12-01 MED FILL — SUMATRIPTAN SUCC 100 MG TAB: 100 | 30 days supply | Qty: 9 | Fill #0

## 2015-12-12 ENCOUNTER — Ambulatory Visit: Payer: Self-pay | Admitting: Gastroenterology

## 2016-01-25 ENCOUNTER — Ambulatory Visit (INDEPENDENT_AMBULATORY_CARE_PROVIDER_SITE_OTHER): Payer: 59 | Admitting: Gastroenterology

## 2016-01-25 ENCOUNTER — Encounter: Payer: Self-pay | Admitting: Gastroenterology

## 2016-01-25 VITALS — BP 138/84 | HR 80 | Ht 73.0 in | Wt 253.0 lb

## 2016-01-25 DIAGNOSIS — Z1211 Encounter for screening for malignant neoplasm of colon: Secondary | ICD-10-CM | POA: Diagnosis not present

## 2016-01-25 DIAGNOSIS — R195 Other fecal abnormalities: Secondary | ICD-10-CM

## 2016-01-25 MED ORDER — NA SULFATE-K SULFATE-MG SULF 17.5-3.13-1.6 GM/177ML PO SOLN: 1.0000 | Freq: Once | ORAL | Status: DC

## 2016-01-25 NOTE — Patient Instructions (Addendum)
You have been scheduled for a colonoscopy. Please follow written instructions given to you at your visit today.  Please pick up your prep supplies at the pharmacy within the next 1-3 days. If you use inhalers (even only as needed), please bring them with you on the day of your procedure. Your physician has requested that you go to www.startemmi.com and enter the access code given to you at your visit today. This web site gives a general overview about your procedure. However, you should still follow specific instructions given to you by our office regarding your preparation for the procedure.  If you are age 72 or older, your body mass index should be between 23-30. Your Body mass index is 33.39 kg/(m^2). If this is out of the aforementioned range listed, please consider follow up with your Primary Care Provider.  If you are age 49 or younger, your body mass index should be between 19-25. Your Body mass index is 33.39 kg/(m^2). If this is out of the aformentioned range listed, please consider follow up with your Primary Care Provider.

## 2016-01-25 NOTE — Progress Notes (Signed)
HPI :  50 y/o male here in consultation from Dr. Shon Baton for heme positive stools. He has a history of HTN, otherwise is healthy from what he reports.   He denies any overt blood in the stools routinely, although does have occasional blood noticed on the toilet paper after passing a hard stool. He thinks it has been ongoing for a long time. He reports normally he has loose stools. He is having one BM in the morning at baseline, sometimes twice. No abdominal pains that are routine that bother him. No perianal pain. No unexpected weight loss, he is trying to lose weight. Eating well, no postprandial vomiting. Great grandmother had colon cancer. No prior colonoscopy. He is otherwise in generally good health. He lost weight and was able to stop his HTN medications. Otherwise feels well without complaints.    Past Medical History  Diagnosis Date  . Hypertension   . Pneumonia     hx of in childhood   . GERD (gastroesophageal reflux disease)      Past Surgical History  Procedure Laterality Date  . Wisdom tooth extraction      bottom bilat   . Compound fx      left leg at age 41  . Umbilical hernia repair N/A 09/16/2014    Procedure: LAP ASSISTED UMBILICAL HERNIA REPAIR WITH MESH;  Surgeon: Gayland Curry, MD;  Location: [facility]S;  Service: General;  Laterality: N/A;  . Insertion of mesh N/A 09/16/2014    Procedure: INSERTION OF MESH;  Surgeon: Gayland Curry, MD;  Location: [facility] ORS;  Service: General;  Laterality: N/A;   Family History  Problem Relation Age of Onset  . Colon cancer      [employer]  . Diabetes Maternal Grandfather   . Diabetes Mother   . Heart disease Maternal Grandfather    Social History  Substance Use Topics  . Smoking status: Former Smoker -- 0.50 packs/day for 10 years    Types: Cigarettes  . Smokeless tobacco: Never Used  . Alcohol Use: Yes     Comment: occasional   Current Outpatient Prescriptions  Medication Sig Dispense Refill  . ibuprofen  (ADVIL,MOTRIN) 200 MG tablet Take 600 mg by mouth every 6 (six) hours as needed for headache or moderate pain.    Marland Kitchen loratadine (CLARITIN) 10 MG tablet Take 10 mg by mouth daily as needed for allergies.    . Multiple Vitamin (MULTIVITAMIN WITH MINERALS) TABS tablet Take 1 tablet by mouth every morning.    . Omega-3 Fatty Acids (FISH OIL PO) Take 565 mg by mouth every morning.    . Probiotic Product (PROBIOTIC PO) Take 1 tablet by mouth every morning.    . sodium chloride (OCEAN) 0.65 % SOLN nasal spray Place 1-2 sprays into both nostrils daily as needed for congestion.     No current facility-administered medications for this visit.   No Known Allergies   Review of Systems: All systems reviewed and negative except where noted in HPI.   No results found for: OCCULTBLD  Lab Results  Component Value Date   WBC 7.0 09/12/2014   HGB 14.7 09/12/2014   HCT 41.3 09/12/2014   MCV 91.8 09/12/2014   PLT 187 09/12/2014     Physical Exam: BP 138/84 mmHg  Pulse 80  Ht 6\' 1"  (1.854 m)  Wt 253 lb (114.76 kg)  BMI 33.39 kg/m2 Constitutional: Pleasant,well-developed, male in no acute distress. HEENT: Normocephalic and atraumatic. Conjunctivae are normal. No scleral icterus.  Neck supple.  Cardiovascular: Normal rate, regular rhythm.  Pulmonary/chest: Effort normal and breath sounds normal. No wheezing, rales or rhonchi. Abdominal: Soft, nondistended, nontender. Bowel sounds active throughout. There are no masses palpable. No hepatomegaly. Extremities: no edema Lymphadenopathy: No cervical adenopathy noted. Neurological: Alert and oriented to person place and time. Skin: Skin is warm and dry. No rashes noted. Psychiatric: Normal mood and affect. Behavior is normal.   ASSESSMENT AND PLAN: 50 y/o male with no prior CRC screening, presenting with history of scant bleeding on the toilet paper history and noted to have (+) occult blood in his stool. I discussed the differential of this finding  with him. He could simply have hemorrhoidal bleeding, however recommend a colonoscopy to ensure no bleeding polyp or mass lesion. The indications, risks, and benefits of colonoscopy were explained to the patient in detail. Risks include but are not limited to bleeding, perforation, adverse reaction to medications, and cardiopulmonary compromise. Sequelae include but are not limited to the possibility of surgery, hospitalization, and mortality. The patient verbalized understanding and wished to proceed. All questions answered, referred to the scheduler and bowel prep ordered. Further recommendations pending results of the exam.   Thrall Cellar, MD [facility] Pager [phone]  CC: Shon Baton, MD

## 2016-02-01 MED FILL — SUPREP BOWEL PREP KIT: 17.5-3.13-1 | 1 days supply | Qty: 354 | Fill #0

## 2016-03-26 ENCOUNTER — Encounter: Payer: 59 | Admitting: Gastroenterology

## 2016-04-08 ENCOUNTER — Encounter: Payer: Self-pay | Admitting: Gastroenterology

## 2016-04-22 ENCOUNTER — Ambulatory Visit (AMBULATORY_SURGERY_CENTER): Payer: 59 | Admitting: Gastroenterology

## 2016-04-22 ENCOUNTER — Encounter: Payer: Self-pay | Admitting: Gastroenterology

## 2016-04-22 VITALS — BP 128/81 | HR 71 | Temp 98.2°F | Resp 11 | Ht 72.0 in | Wt 253.0 lb

## 2016-04-22 DIAGNOSIS — D123 Benign neoplasm of transverse colon: Secondary | ICD-10-CM | POA: Diagnosis not present

## 2016-04-22 DIAGNOSIS — D122 Benign neoplasm of ascending colon: Secondary | ICD-10-CM

## 2016-04-22 DIAGNOSIS — D124 Benign neoplasm of descending colon: Secondary | ICD-10-CM

## 2016-04-22 DIAGNOSIS — Z1211 Encounter for screening for malignant neoplasm of colon: Secondary | ICD-10-CM

## 2016-04-22 DIAGNOSIS — R195 Other fecal abnormalities: Secondary | ICD-10-CM | POA: Diagnosis not present

## 2016-04-22 MED ORDER — SODIUM CHLORIDE 0.9 % IV SOLN: 500.0000 mL | INTRAVENOUS | Status: DC

## 2016-04-22 NOTE — Progress Notes (Signed)
Called to room to assist during endoscopic procedure.  Patient ID and intended procedure confirmed with present staff. Received instructions for my participation in the procedure from the performing physician.  

## 2016-04-22 NOTE — Progress Notes (Signed)
Report given to PACU RN, vss 

## 2016-04-22 NOTE — Patient Instructions (Signed)
YOU HAD AN ENDOSCOPIC PROCEDURE TODAY AT [facility]:   Refer to the procedure report that was given to you for any specific questions about what was found during the examination.  If the procedure report does not answer your questions, please call your gastroenterologist to clarify.  If you requested that your care partner not be given the details of your procedure findings, then the procedure report has been included in a sealed envelope for you to review at your convenience later.  YOU SHOULD EXPECT: Some feelings of bloating in the abdomen. Passage of more gas than usual.  Walking can help get rid of the air that was put into your GI tract during the procedure and reduce the bloating. If you had a lower endoscopy (such as a colonoscopy or flexible sigmoidoscopy) you may notice spotting of blood in your stool or on the toilet paper. If you underwent a bowel prep for your procedure, you may not have a normal bowel movement for a few days.  Please Note:  You might notice some irritation and congestion in your nose or some drainage.  This is from the oxygen used during your procedure.  There is no need for concern and it should clear up in a day or so.  SYMPTOMS TO REPORT IMMEDIATELY:   Following lower endoscopy (colonoscopy or flexible sigmoidoscopy):  Excessive amounts of blood in the stool  Significant tenderness or worsening of abdominal pains  Swelling of the abdomen that is new, acute  Fever of 100F or higher   For urgent or emergent issues, a gastroenterologist can be reached at any hour by calling [phone].   DIET: Your first meal following the procedure should be a small meal and then it is ok to progress to your normal diet. Heavy or fried foods are harder to digest and may make you feel nauseous or bloated.  Likewise, meals heavy in dairy and vegetables can increase bloating.  Drink plenty of fluids but you should avoid alcoholic beverages for 24  hours.  ACTIVITY:  You should plan to take it easy for the rest of today and you should NOT DRIVE or use heavy machinery until tomorrow (because of the sedation medicines used during the test).    FOLLOW UP: Our staff will call the number listed on your records the next business day following your procedure to check on you and address any questions or concerns that you may have regarding the information given to you following your procedure. If we do not reach you, we will leave a message.  However, if you are feeling well and you are not experiencing any problems, there is no need to return our call.  We will assume that you have returned to your regular daily activities without incident.  If any biopsies were taken you will be contacted by phone or by letter within the next 1-3 weeks.  Please call us at [phone] if you have not heard about the biopsies in 3 weeks.    SIGNATURES/CONFIDENTIALITY: You and/or your care partner have signed paperwork which will be entered into your electronic medical record.  These signatures attest to the fact that that the information above on your After Visit Summary has been reviewed and is understood.  Full responsibility of the confidentiality of this discharge information lies with you and/or your care-partner.  Polyps, Diverticulosis, hemorrhoids handout provided. No ASA or NSAIDS for 2 weeks. Repeat colonoscopy per pathology results.

## 2016-04-22 NOTE — Op Note (Signed)
[place] Patient Name: Robert Robert: 04/22/2016 8:44 AM MRN: [id number] Endoscopist: Remo Lipps P. Havery Robert Robert , MD Age: 50 Referring MD:  Robert Robert: 13-Dec-1965 Gender: Male Account #: [id number] Procedure:                Colonoscopy Indications:              Positive FIT test, Screening for malignant neoplasm                            in the colon, This is the patient's first                            colonoscopy Medicines:                Monitored Anesthesia Care Procedure:                Pre-Anesthesia Assessment:                           - Prior to the procedure, a History and Physical                            was performed, and patient medications and                            allergies were reviewed. The patient's tolerance of                            previous anesthesia was also reviewed. The risks                            and benefits of the procedure and the sedation                            options and risks were discussed with the patient.                            All questions were answered, and informed consent                            was obtained. Prior Anticoagulants: The patient has                            taken no previous anticoagulant or antiplatelet                            agents. ASA Grade Assessment: II - A patient with                            mild systemic disease. After reviewing the risks                            and benefits, the patient was deemed in  satisfactory condition to undergo the procedure.                           After obtaining informed consent, the colonoscope                            was passed under direct vision. Throughout the                            procedure, the patient's blood pressure, pulse, and                            oxygen saturations were monitored continuously. The                            Model CF-HQ190L [id number]) scope was introduced                             through the anus and advanced to the the terminal                            ileum, with identification of the appendiceal                            orifice and IC valve. The colonoscopy was performed                            without difficulty. The patient tolerated the                            procedure well. The quality of the bowel                            preparation was good. The terminal ileum, ileocecal                            valve, appendiceal orifice, and rectum were                            photographed. Scope In: 8:50:59 AM Scope Out: 9:08:01 AM Scope Withdrawal Time: 0 hours 14 minutes 52 seconds  Total Procedure Duration: 0 hours 17 minutes 2 seconds  Findings:                 The perianal and digital rectal examinations were                            normal.                           A 5 mm polyp was found in the ascending colon. The                            polyp was sessile. The polyp was removed with a  cold snare. Resection and retrieval were complete.                           A 4 mm polyp was found in the hepatic flexure. The                            polyp was sessile. The polyp was removed with a                            cold snare. Resection and retrieval were complete.                           Two sessile polyps were found in the descending                            colon. The polyps were 3 to 4 mm in size. These                            polyps were removed with a cold snare. Resection                            and retrieval were complete.                           Scattered small and large-mouthed diverticula were                            found in the entire colon.                           Non-bleeding internal hemorrhoids were found during                            retroflexion.                           The terminal ileum appeared normal.                           The exam was  otherwise without abnormality. Complications:            No immediate complications. Estimated blood loss:                            Minimal. Estimated Blood Loss:     Estimated blood loss was minimal. Impression:               - One 5 mm polyp in the ascending colon, removed                            with a cold snare. Resected and retrieved.                           - One 4 mm polyp at the hepatic flexure, removed  with a cold snare. Resected and retrieved.                           - Two 3 to 4 mm polyps in the descending colon,                            removed with a cold snare. Resected and retrieved.                           - Diverticulosis in the entire examined colon.                           - Non-bleeding internal hemorrhoids.                           - The examined portion of the ileum was normal.                           - The examination was otherwise normal. Recommendation:           - Patient has a contact number available for                            emergencies. The signs and symptoms of potential                            delayed complications were discussed with the                            patient. Return to normal activities tomorrow.                            Written discharge instructions were provided to the                            patient.                           - Resume previous diet.                           - Continue present medications.                           - No aspirin, ibuprofen, naproxen, or other                            non-steroidal anti-inflammatory drugs for 2 weeks                            after polyp removal.                           - Await pathology results.                           -  Repeat colonoscopy is recommended for                            surveillance. The colonoscopy Robert will be                            determined after pathology results from today's                             exam become available for review. Remo Lipps P. Dublin Grayer, MD 04/22/2016 9:13:29 AM This report has been signed electronically.

## 2016-04-23 ENCOUNTER — Telehealth: Payer: Self-pay

## 2016-04-23 NOTE — Telephone Encounter (Signed)
  Follow up Call-  Call back number 04/22/2016  Post procedure Call Back phone  # [phone]  Permission to leave phone message Yes     Patient questions:  Do you have a fever, pain , or abdominal swelling? No. Pain Score  0 *  Have you tolerated food without any problems? Yes.    Have you been able to return to your normal activities? Yes.    Do you have any questions about your discharge instructions: Diet   No. Medications  No. Follow up visit  No.  Do you have questions or concerns about your Care? No.  Actions: * If pain score is 4 or above: No action needed, pain <4.

## 2016-05-01 ENCOUNTER — Encounter: Payer: Self-pay | Admitting: Gastroenterology

## 2016-10-08 MED FILL — IRBESARTAN 300 MG TABLET: 300 | 30 days supply | Qty: 30 | Fill #0

## 2016-10-08 MED FILL — ATORVASTATIN 20 MG TABLET: 20 | 30 days supply | Qty: 30 | Fill #0

## 2016-10-30 MED FILL — LOSARTAN POTASSIUM 100 MG T: 100 | 30 days supply | Qty: 30 | Fill #0

## 2016-11-06 MED FILL — ATORVASTATIN 20 MG TABLET: 20 | 30 days supply | Qty: 30 | Fill #1

## 2016-11-11 DIAGNOSIS — R509 Fever, unspecified: Secondary | ICD-10-CM | POA: Diagnosis not present

## 2016-11-11 MED FILL — OSELTAMIVIR PHOS 75 MG CAP: 75 | 5 days supply | Qty: 10 | Fill #0

## 2016-11-19 DIAGNOSIS — E784 Other hyperlipidemia: Secondary | ICD-10-CM | POA: Diagnosis not present

## 2016-11-19 DIAGNOSIS — I10 Essential (primary) hypertension: Secondary | ICD-10-CM | POA: Diagnosis not present

## 2016-11-27 MED FILL — LOSARTAN POTASSIUM 100 MG T: 100 | 30 days supply | Qty: 30 | Fill #1

## 2016-12-05 MED FILL — ATORVASTATIN 20 MG TABLET: 20 | 30 days supply | Qty: 30 | Fill #2

## 2016-12-26 DIAGNOSIS — N503 Cyst of epididymis: Secondary | ICD-10-CM | POA: Diagnosis not present

## 2016-12-26 MED FILL — LOSARTAN POTASSIUM 100 MG T: 100 | 30 days supply | Qty: 30 | Fill #2

## 2017-01-02 MED FILL — ATORVASTATIN 20 MG TABLET: 20 | 30 days supply | Qty: 30 | Fill #3

## 2017-04-29 ENCOUNTER — Other Ambulatory Visit: Payer: Self-pay | Admitting: Internal Medicine

## 2017-04-29 DIAGNOSIS — E784 Other hyperlipidemia: Secondary | ICD-10-CM | POA: Diagnosis not present

## 2017-04-29 DIAGNOSIS — R319 Hematuria, unspecified: Secondary | ICD-10-CM

## 2017-04-29 DIAGNOSIS — I10 Essential (primary) hypertension: Secondary | ICD-10-CM | POA: Diagnosis not present

## 2017-04-29 DIAGNOSIS — R3121 Asymptomatic microscopic hematuria: Secondary | ICD-10-CM | POA: Diagnosis not present

## 2017-05-06 ENCOUNTER — Ambulatory Visit
Admission: RE | Admit: 2017-05-06 | Discharge: 2017-05-06 | Disposition: A | Discharge status: Home / Self Care | Payer: 59 | Source: Ambulatory Visit | Attending: Internal Medicine | Admitting: Internal Medicine

## 2017-05-06 DIAGNOSIS — N201 Calculus of ureter: Secondary | ICD-10-CM | POA: Diagnosis not present

## 2017-05-06 DIAGNOSIS — R319 Hematuria, unspecified: Secondary | ICD-10-CM

## 2017-05-27 MED FILL — TAMSULOSIN HCL 0.4 MG CAP: 0.4 | 14 days supply | Qty: 14 | Fill #0

## 2017-05-27 MED FILL — traMADol HCL 50 MG TABS: 50 | 10 days supply | Qty: 20 | Fill #0

## 2017-07-25 DIAGNOSIS — R311 Benign essential microscopic hematuria: Secondary | ICD-10-CM | POA: Diagnosis not present

## 2017-07-25 DIAGNOSIS — N201 Calculus of ureter: Secondary | ICD-10-CM | POA: Diagnosis not present

## 2017-07-25 DIAGNOSIS — R3914 Feeling of incomplete bladder emptying: Secondary | ICD-10-CM | POA: Diagnosis not present

## 2017-07-25 MED FILL — TAMSULOSIN HCL 0.4 MG CAP: 0.4 | 30 days supply | Qty: 30 | Fill #0

## 2017-08-01 ENCOUNTER — Ambulatory Visit: Payer: Self-pay | Admitting: Urgent Care

## 2017-08-01 DIAGNOSIS — H5711 Ocular pain, right eye: Secondary | ICD-10-CM | POA: Diagnosis not present

## 2017-08-01 MED FILL — OFLOXACIN 0.3% EYE DROPS: 0.3 | 5 days supply | Qty: 5 | Fill #0

## 2017-09-02 DIAGNOSIS — R311 Benign essential microscopic hematuria: Secondary | ICD-10-CM | POA: Diagnosis not present

## 2017-10-07 DIAGNOSIS — I10 Essential (primary) hypertension: Secondary | ICD-10-CM | POA: Diagnosis not present

## 2017-10-07 DIAGNOSIS — Z125 Encounter for screening for malignant neoplasm of prostate: Secondary | ICD-10-CM | POA: Diagnosis not present

## 2017-10-07 DIAGNOSIS — Z0000 Encounter for general adult medical examination without abnormal findings: Secondary | ICD-10-CM | POA: Diagnosis not present

## 2017-10-14 DIAGNOSIS — Z0000 Encounter for general adult medical examination without abnormal findings: Secondary | ICD-10-CM | POA: Diagnosis not present

## 2017-10-14 DIAGNOSIS — I10 Essential (primary) hypertension: Secondary | ICD-10-CM | POA: Diagnosis not present

## 2017-10-14 DIAGNOSIS — E7849 Other hyperlipidemia: Secondary | ICD-10-CM | POA: Diagnosis not present

## 2017-10-14 MED FILL — SILDENAFIL CITRATE 100 MG T: 100 | 31 days supply | Qty: 3 | Fill #0

## 2017-10-14 MED FILL — ATORVASTATIN 20 MG TABLET: 20 | 30 days supply | Qty: 30 | Fill #0

## 2017-10-14 MED FILL — LOSARTAN POTASSIUM 100 MG T: 100 | 30 days supply | Qty: 30 | Fill #0

## 2017-10-16 DIAGNOSIS — Z1212 Encounter for screening for malignant neoplasm of rectum: Secondary | ICD-10-CM | POA: Diagnosis not present

## 2017-11-21 MED FILL — LOSARTAN POTASSIUM 100 MG T: 100 | 30 days supply | Qty: 30 | Fill #1

## 2017-11-21 MED FILL — ATORVASTATIN 20 MG TABLET: 20 | 30 days supply | Qty: 30 | Fill #1

## 2017-12-24 MED FILL — ATORVASTATIN 20 MG TABLET: 20 | 30 days supply | Qty: 30 | Fill #2

## 2017-12-24 MED FILL — LOSARTAN POTASSIUM 100 MG T: 100 | 30 days supply | Qty: 30 | Fill #2

## 2018-01-23 MED FILL — LOSARTAN POTASSIUM 100 MG T: 100 | 30 days supply | Qty: 30 | Fill #3

## 2018-01-23 MED FILL — SILDENAFIL CITRATE 100 MG T: 100 | 30 days supply | Qty: 3 | Fill #1

## 2018-01-23 MED FILL — ATORVASTATIN 20 MG TABLET: 20 | 30 days supply | Qty: 30 | Fill #3

## 2018-02-20 MED FILL — ATORVASTATIN 20 MG TABLET: 20 | 30 days supply | Qty: 30 | Fill #4

## 2018-02-20 MED FILL — LOSARTAN POTASSIUM 100 MG T: 100 | 30 days supply | Qty: 30 | Fill #4

## 2018-03-13 DIAGNOSIS — L814 Other melanin hyperpigmentation: Secondary | ICD-10-CM | POA: Diagnosis not present

## 2018-03-13 DIAGNOSIS — L821 Other seborrheic keratosis: Secondary | ICD-10-CM | POA: Diagnosis not present

## 2018-03-13 DIAGNOSIS — D485 Neoplasm of uncertain behavior of skin: Secondary | ICD-10-CM | POA: Diagnosis not present

## 2018-03-13 DIAGNOSIS — L570 Actinic keratosis: Secondary | ICD-10-CM | POA: Diagnosis not present

## 2018-03-19 DIAGNOSIS — R3914 Feeling of incomplete bladder emptying: Secondary | ICD-10-CM | POA: Diagnosis not present

## 2018-03-19 DIAGNOSIS — N401 Enlarged prostate with lower urinary tract symptoms: Secondary | ICD-10-CM | POA: Diagnosis not present

## 2018-03-19 MED FILL — TAMSULOSIN HCL 0.4 MG CAP: 0.4 | 30 days supply | Qty: 30 | Fill #0

## 2018-03-23 MED FILL — ATORVASTATIN 20 MG TABLET: 20 | 30 days supply | Qty: 30 | Fill #5

## 2018-03-23 MED FILL — LOSARTAN POTASSIUM 100 MG T: 100 | 30 days supply | Qty: 30 | Fill #5

## 2018-04-15 DIAGNOSIS — L710 Perioral dermatitis: Secondary | ICD-10-CM | POA: Diagnosis not present

## 2018-04-15 MED FILL — MINOCYCLINE HCL 100 MG CAPS: 100 | 30 days supply | Qty: 60 | Fill #0

## 2018-04-16 MED FILL — ATORVASTATIN CALCIUM 20 MG: 20 | 30 days supply | Qty: 30 | Fill #6

## 2018-04-16 MED FILL — TAMSULOSIN HCL 0.4 MG CAP: 0.4 | 30 days supply | Qty: 30 | Fill #1

## 2018-04-16 MED FILL — LOSARTAN POTASSIUM 100 MG T: 100 | 30 days supply | Qty: 30 | Fill #6

## 2018-04-21 DIAGNOSIS — I10 Essential (primary) hypertension: Secondary | ICD-10-CM | POA: Diagnosis not present

## 2018-04-21 DIAGNOSIS — E7849 Other hyperlipidemia: Secondary | ICD-10-CM | POA: Diagnosis not present

## 2018-05-18 MED FILL — LOSARTAN POTASSIUM 100 MG T: 100 | 30 days supply | Qty: 30 | Fill #7

## 2018-05-18 MED FILL — ATORVASTATIN CALCIUM 20 MG: 20 | 30 days supply | Qty: 30 | Fill #7

## 2018-06-04 MED FILL — TAMSULOSIN HCL 0.4 MG CAP: 0.4 | 30 days supply | Qty: 30 | Fill #2

## 2018-06-22 MED FILL — ATORVASTATIN CALCIUM 20 MG: 20 | 30 days supply | Qty: 30 | Fill #8

## 2018-06-22 MED FILL — LOSARTAN POTASSIUM 100 MG T: 100 | 30 days supply | Qty: 30 | Fill #8

## 2018-06-23 DIAGNOSIS — R311 Benign essential microscopic hematuria: Secondary | ICD-10-CM | POA: Diagnosis not present

## 2018-06-23 DIAGNOSIS — L710 Perioral dermatitis: Secondary | ICD-10-CM | POA: Diagnosis not present

## 2018-06-23 DIAGNOSIS — L738 Other specified follicular disorders: Secondary | ICD-10-CM | POA: Diagnosis not present

## 2018-06-23 MED FILL — DOXYCYCLINE HYCLATE 100 MG: 100 | 30 days supply | Qty: 60 | Fill #0

## 2018-07-06 MED FILL — TAMSULOSIN HCL 0.4 MG CAP: 0.4 | 30 days supply | Qty: 30 | Fill #0

## 2018-07-07 MED FILL — SILDENAFIL CITRATE 100 MG T: 100 | 30 days supply | Qty: 3 | Fill #2

## 2018-07-21 MED FILL — LOSARTAN POTASSIUM 100 MG T: 100 | 30 days supply | Qty: 30 | Fill #9

## 2018-07-21 MED FILL — ATORVASTATIN CALCIUM 20 MG: 20 | 30 days supply | Qty: 30 | Fill #9

## 2018-07-24 DIAGNOSIS — L738 Other specified follicular disorders: Secondary | ICD-10-CM | POA: Diagnosis not present

## 2018-07-24 MED FILL — DOXYCYCLINE HYCLATE 100 MG: 100 | 30 days supply | Qty: 60 | Fill #0

## 2018-08-03 MED FILL — CEFDINIR 300 MG CAPS: 300 | 10 days supply | Qty: 20 | Fill #0

## 2018-08-07 MED FILL — SILDENAFIL CITRATE 100 MG T: 100 | 30 days supply | Qty: 3 | Fill #3

## 2018-08-07 MED FILL — TAMSULOSIN HCL 0.4 MG CAP: 0.4 | 30 days supply | Qty: 30 | Fill #1

## 2018-08-20 MED FILL — LOSARTAN POTASSIUM 100 MG T: 100 | 30 days supply | Qty: 30 | Fill #10

## 2018-08-20 MED FILL — ATORVASTATIN CALCIUM 20 MG: 20 | 30 days supply | Qty: 30 | Fill #10

## 2018-09-09 MED FILL — TAMSULOSIN HCL 0.4 MG CAP: 0.4 | 30 days supply | Qty: 30 | Fill #2

## 2018-09-17 IMAGING — CT CT ABD-PELV W/O CM
2 of 4 series · 13 of 36 positions shown, 19 images · non-contrast
Comparison: None.

CLINICAL DATA: Chronic hematuria.  History of kidney stones.

EXAM:
CT ABDOMEN AND PELVIS WITHOUT CONTRAST
TECHNIQUE: Multidetector CT imaging of the abdomen and pelvis was performed
following the standard protocol without IV contrast.

[Series 601: coronal body · coronal · 1.08mm/px · 1 of 137 slices shown, 2 images]
[im 46/137  soft-tissue]
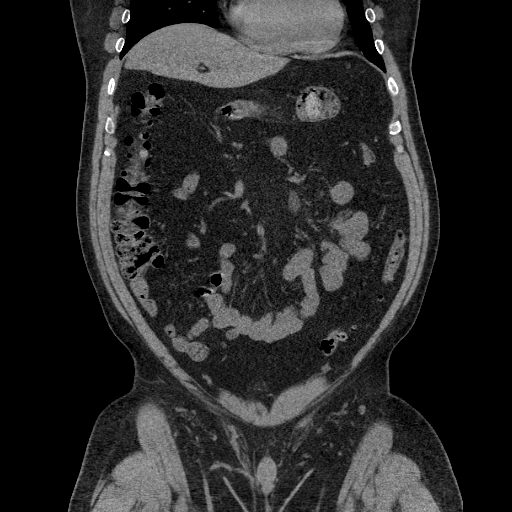
[im 46/137  bone]
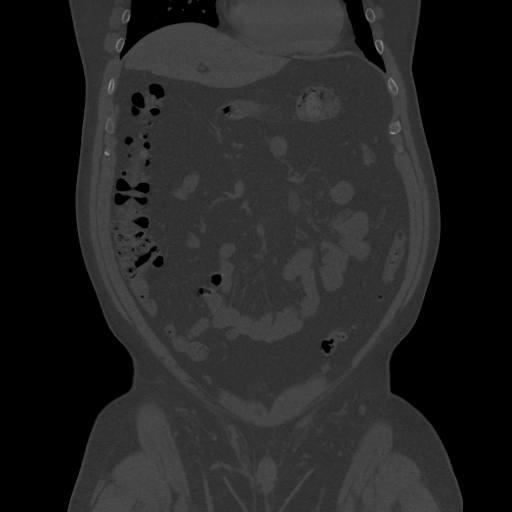

[Series 602: sagittal body · sagittal · 1.08mm/px · 12 of 188 slices shown, 17 images]
[im 11/188  soft-tissue]
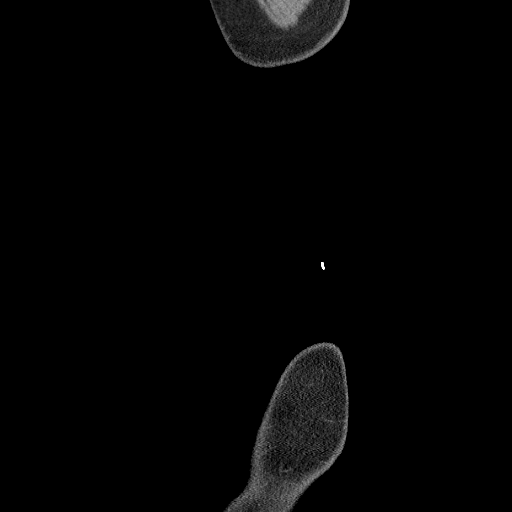
[im 11/188  lung]
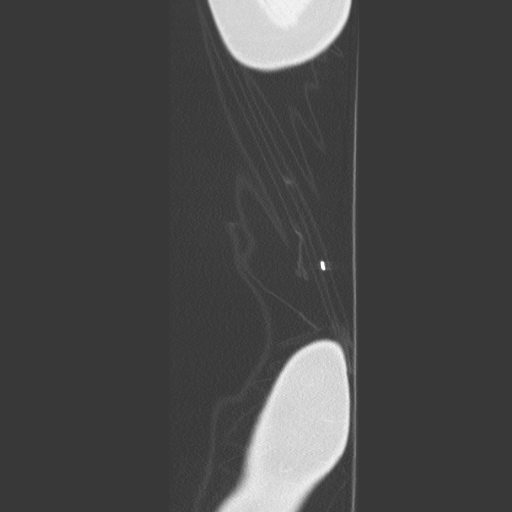
[im 11/188  bone]
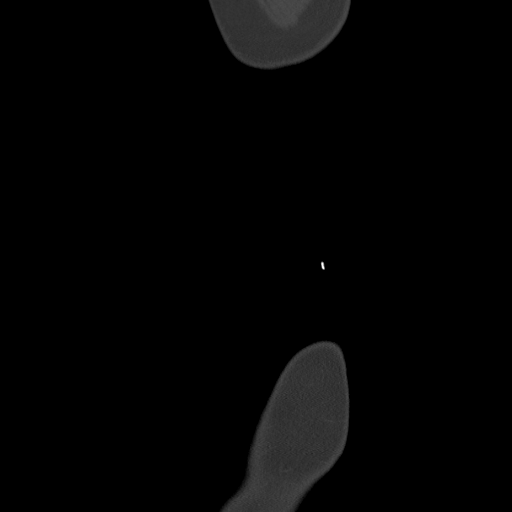
[im 21/188  lung]
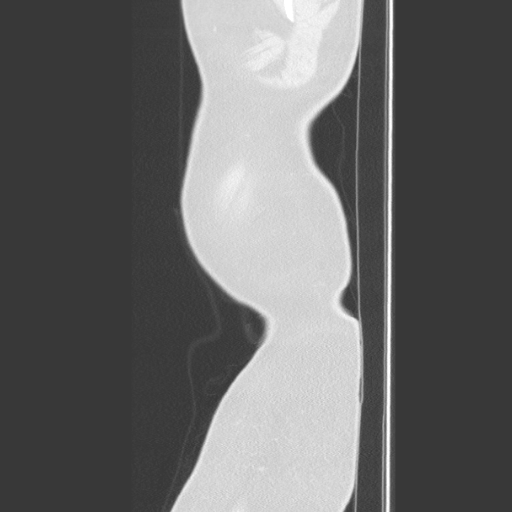
[im 32/188  soft-tissue]
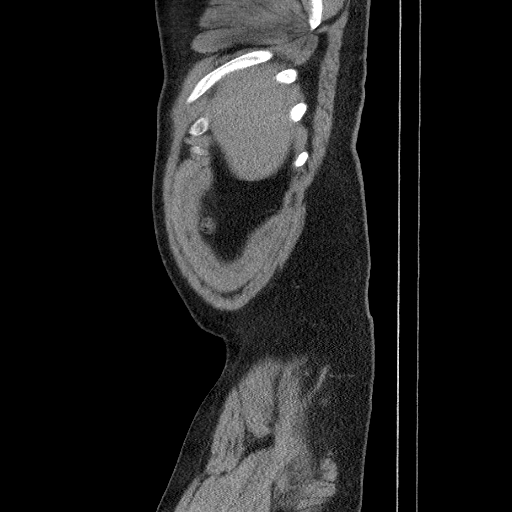
[im 32/188  lung]
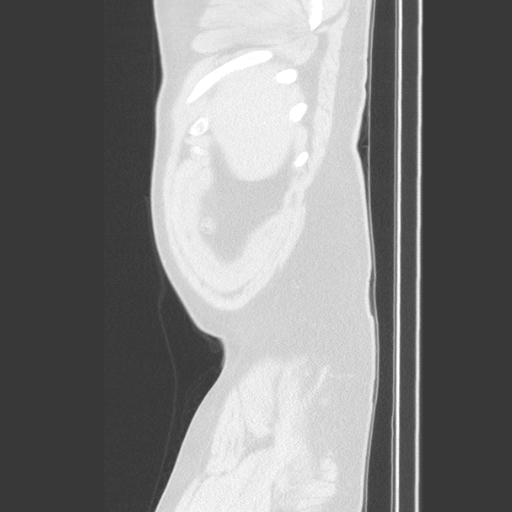
[im 42/188  soft-tissue]
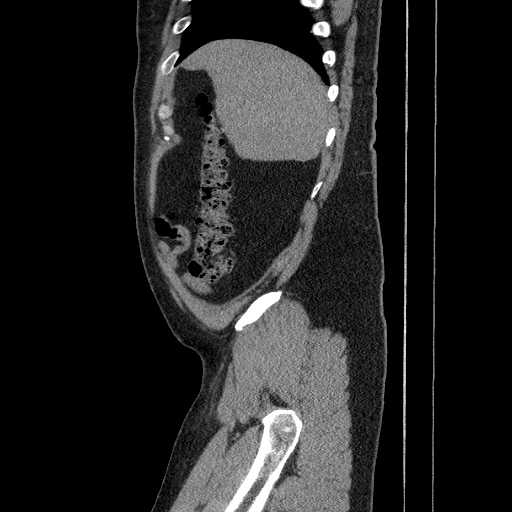
[im 42/188  lung]
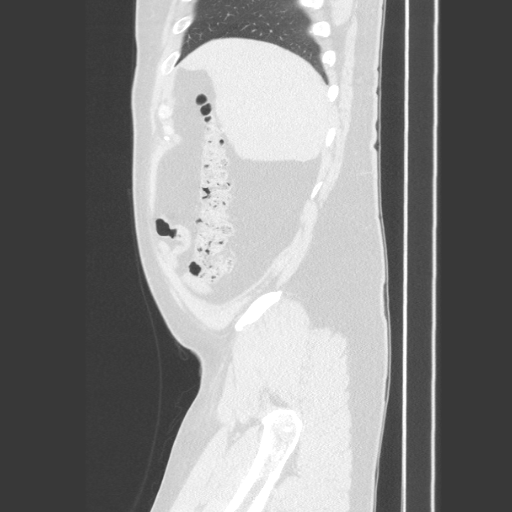
[im 63/188  soft-tissue]
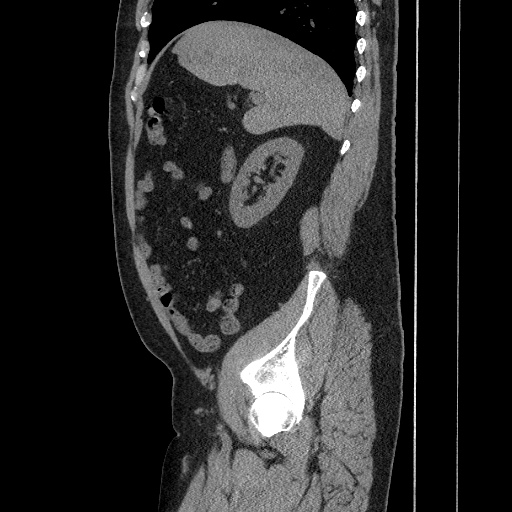
[im 73/188  soft-tissue]
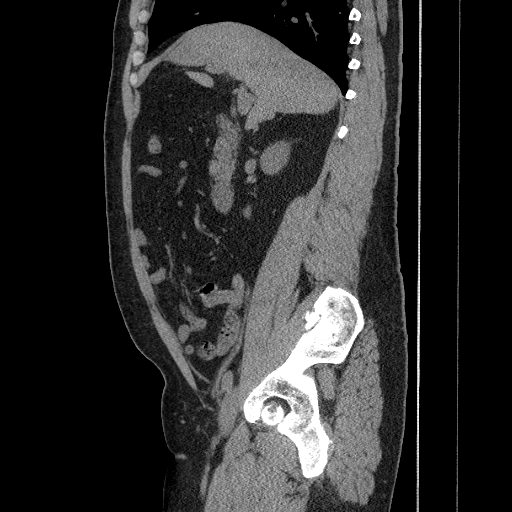
[im 94/188  soft-tissue]
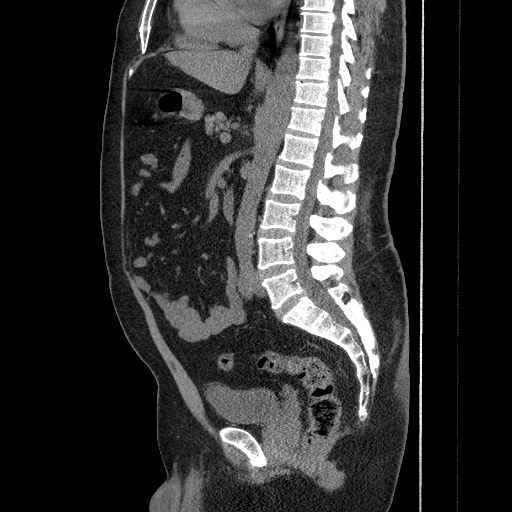
[im 115/188  soft-tissue]
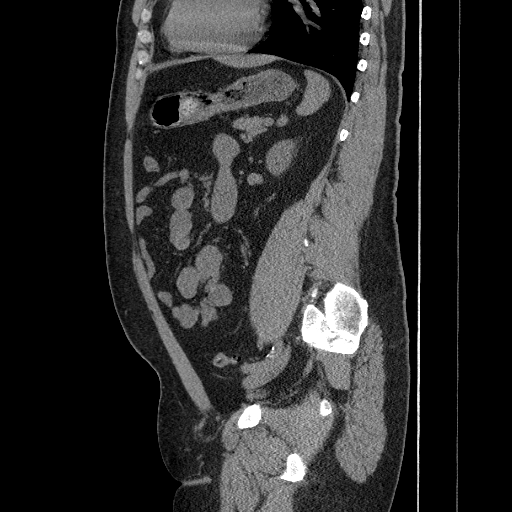
[im 125/188  soft-tissue]
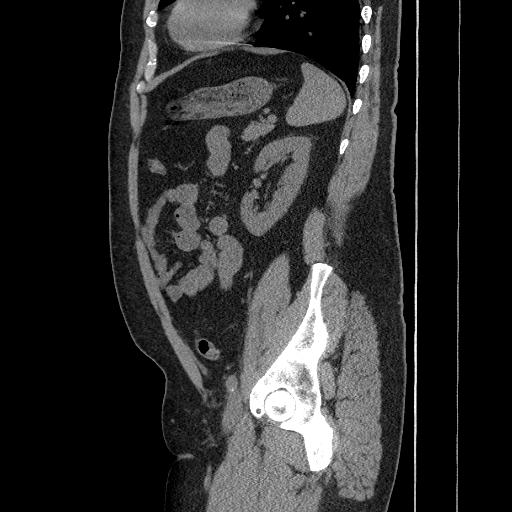
[im 146/188  soft-tissue]
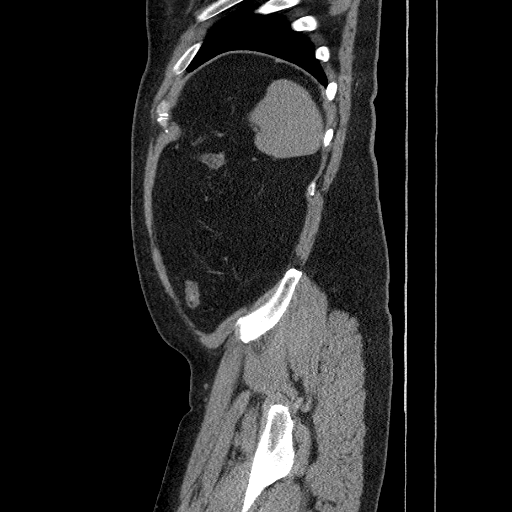
[im 146/188  bone]
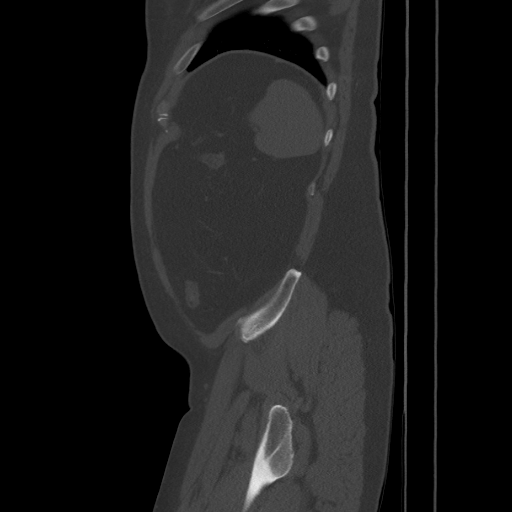
[im 156/188  soft-tissue]
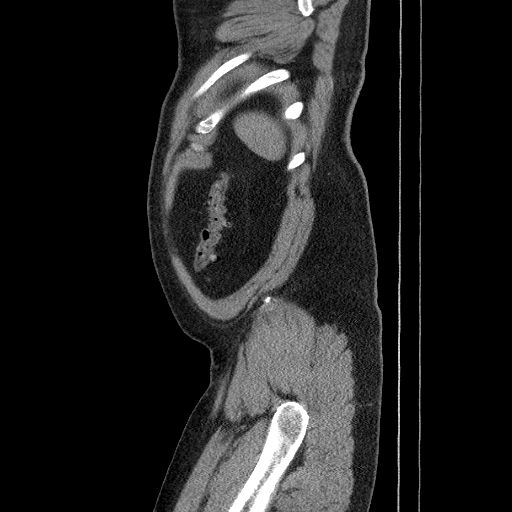
[im 177/188  soft-tissue]
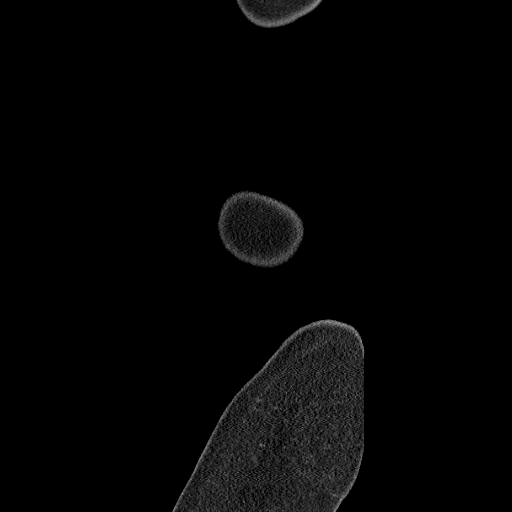

[13 of 36 positions shown; findings below may reference images not displayed]

FINDINGS: LOWER CHEST: Lung bases are clear. The visualized heart size is
normal. No pericardial effusion.

HEPATOBILIARY: Subcentimeter gallstone within contracted
gallbladder, no CT findings of acute cholecystitis. Too small to
characterize hypodensity LEFT lobe of the liver, liver is otherwise
unremarkable.

PANCREAS: Normal.

SPLEEN: Normal.

ADRENALS/URINARY TRACT: Kidneys are orthotopic, demonstrating normal
size and morphology. No nephrolithiasis, hydronephrosis; limited
assessment for renal masses on this nonenhanced examination. 16 mm
homogeneously hypodense benign-appearing cyst LEFT interpolar
kidney. 2 mm RIGHT proximal ureteral calculus. Urinary bladder is
partially distended and unremarkable. Normal adrenal glands.

STOMACH/BOWEL: The stomach, small and large bowel are normal in
course and caliber without inflammatory changes, sensitivity
decreased by lack of enteric contrast. Moderate descending and
sigmoid diverticulosis with a few additional scattered diverticula
throughout the colon. Normal appendix.

VASCULAR/LYMPHATIC: Aortoiliac vessels are normal in course and
caliber, trace calcific atherosclerosis. No lymphadenopathy by CT
size criteria. Subcentimeter mesenteric lymph nodes are likely
reactive.

REPRODUCTIVE: Normal.

OTHER: No intraperitoneal free fluid or free air.

MUSCULOSKELETAL: Non-acute.  Small fat containing inguinal hernias.
IMPRESSION: 1. 2 mm nonobstructing proximal RIGHT ureter calculus. No
nephrolithiasis.
2. No acute intra-abdominal or pelvic process.

## 2018-09-18 MED FILL — LOSARTAN POTASSIUM 100 MG T: 100 | 30 days supply | Qty: 30 | Fill #11

## 2018-09-28 MED FILL — SILDENAFIL CITRATE 100 MG T: 100 | 30 days supply | Qty: 3 | Fill #4

## 2018-10-01 MED FILL — ATORVASTATIN CALCIUM 20 MG: 20 | 30 days supply | Qty: 30 | Fill #11

## 2018-10-02 MED FILL — PIMECROLIMUS 1 % CREA: 1 | 10 days supply | Qty: 30 | Fill #0

## 2018-10-14 DIAGNOSIS — Z0000 Encounter for general adult medical examination without abnormal findings: Secondary | ICD-10-CM | POA: Diagnosis not present

## 2018-10-14 DIAGNOSIS — R82998 Other abnormal findings in urine: Secondary | ICD-10-CM | POA: Diagnosis not present

## 2018-10-14 DIAGNOSIS — Z125 Encounter for screening for malignant neoplasm of prostate: Secondary | ICD-10-CM | POA: Diagnosis not present

## 2018-10-14 DIAGNOSIS — I10 Essential (primary) hypertension: Secondary | ICD-10-CM | POA: Diagnosis not present

## 2018-10-19 DIAGNOSIS — Z1389 Encounter for screening for other disorder: Secondary | ICD-10-CM | POA: Diagnosis not present

## 2018-10-19 DIAGNOSIS — R5383 Other fatigue: Secondary | ICD-10-CM | POA: Diagnosis not present

## 2018-10-19 DIAGNOSIS — Z9189 Other specified personal risk factors, not elsewhere classified: Secondary | ICD-10-CM | POA: Diagnosis not present

## 2018-10-19 DIAGNOSIS — Z0000 Encounter for general adult medical examination without abnormal findings: Secondary | ICD-10-CM | POA: Diagnosis not present

## 2018-10-19 MED FILL — TAMSULOSIN HCL 0.4 MG CAP: 0.4 | 30 days supply | Qty: 30 | Fill #3

## 2018-10-19 MED FILL — LOSARTAN POTASSIUM 100 MG T: 100 | 30 days supply | Qty: 30 | Fill #0

## 2018-10-20 DIAGNOSIS — Z1212 Encounter for screening for malignant neoplasm of rectum: Secondary | ICD-10-CM | POA: Diagnosis not present

## 2018-10-23 MED FILL — DOXYCYCLINE HYCLATE 100 MG: 100 | 30 days supply | Qty: 60 | Fill #1

## 2018-10-29 MED FILL — SILDENAFIL CITRATE 100 MG T: 100 | 30 days supply | Qty: 3 | Fill #0

## 2018-11-02 MED FILL — ATORVASTATIN CALCIUM 20 MG: 20 | 30 days supply | Qty: 30 | Fill #0

## 2018-11-16 MED FILL — LOSARTAN POTASSIUM 100 MG T: 100 | 30 days supply | Qty: 30 | Fill #1

## 2018-11-26 MED FILL — TAMSULOSIN HCL 0.4 MG CAP: 0.4 | 30 days supply | Qty: 30 | Fill #4

## 2018-11-27 DIAGNOSIS — R400 Somnolence: Secondary | ICD-10-CM | POA: Diagnosis not present

## 2018-11-30 DIAGNOSIS — R400 Somnolence: Secondary | ICD-10-CM | POA: Diagnosis not present

## 2018-12-02 MED FILL — ATORVASTATIN CALCIUM 20 MG: 20 | 30 days supply | Qty: 30 | Fill #1

## 2018-12-16 MED FILL — LOSARTAN POTASSIUM 100 MG T: 100 | 30 days supply | Qty: 30 | Fill #2

## 2018-12-22 MED FILL — TAMSULOSIN HCL 0.4 MG CAP: 0.4 | 30 days supply | Qty: 30 | Fill #5

## 2018-12-22 MED FILL — SILDENAFIL CITRATE 100 MG T: 100 | 30 days supply | Qty: 3 | Fill #1

## 2019-01-01 MED FILL — ATORVASTATIN 20 MG TABLET: 20 | 30 days supply | Qty: 30 | Fill #2

## 2019-01-12 MED FILL — LOSARTAN POTASSIUM 100 MG T: 100 | 30 days supply | Qty: 30 | Fill #3

## 2019-01-22 MED FILL — LOSARTAN POTASSIUM 100 MG T: 100 | 30 days supply | Qty: 30 | Fill #4

## 2019-01-22 MED FILL — TAMSULOSIN HCL 0.4 MG CAP: 0.4 | 30 days supply | Qty: 30 | Fill #6

## 2019-01-22 MED FILL — ATORVASTATIN 20 MG TABLET: 20 | 30 days supply | Qty: 30 | Fill #3

## 2019-03-01 MED FILL — ATORVASTATIN 20 MG TABLET: 20 | 30 days supply | Qty: 30 | Fill #4

## 2019-03-01 MED FILL — TAMSULOSIN HCL 0.4 MG CAP: 0.4 | 30 days supply | Qty: 30 | Fill #7

## 2019-03-12 MED FILL — LOSARTAN POTASSIUM 100 MG T: 100 | 30 days supply | Qty: 30 | Fill #5

## 2019-03-15 MED FILL — TADALAFIL 10 MG TABS: 10 | 30 days supply | Qty: 6 | Fill #0

## 2019-03-29 MED FILL — ATORVASTATIN 20 MG TABLET: 20 | 30 days supply | Qty: 30 | Fill #5

## 2019-03-29 MED FILL — TAMSULOSIN HCL 0.4 MG CAP: 0.4 | 30 days supply | Qty: 30 | Fill #8

## 2019-04-15 ENCOUNTER — Encounter: Payer: Self-pay | Admitting: Gastroenterology

## 2019-04-16 MED FILL — LOSARTAN POTASSIUM 100 MG T: 100 | 30 days supply | Qty: 30 | Fill #6

## 2019-04-30 MED FILL — ATORVASTATIN 20 MG TABLET: 20 | 30 days supply | Qty: 30 | Fill #6

## 2019-04-30 MED FILL — TAMSULOSIN HCL 0.4 MG CAP: 0.4 | 30 days supply | Qty: 30 | Fill #9

## 2019-05-18 MED FILL — LOSARTAN POTASSIUM 100 MG T: 100 | 30 days supply | Qty: 30 | Fill #7

## 2019-05-28 MED FILL — TAMSULOSIN HCL 0.4 MG CAP: 0.4 | 30 days supply | Qty: 30 | Fill #10

## 2019-05-28 MED FILL — ATORVASTATIN 20 MG TABLET: 20 | 30 days supply | Qty: 30 | Fill #7

## 2019-06-18 MED FILL — LOSARTAN POTASSIUM 100 MG T: 100 | 30 days supply | Qty: 30 | Fill #8

## 2019-06-29 MED FILL — ATORVASTATIN 20 MG TABLET: 20 | 30 days supply | Qty: 30 | Fill #8

## 2019-06-29 MED FILL — TAMSULOSIN HCL 0.4 MG CAP: 0.4 | 30 days supply | Qty: 30 | Fill #0

## 2019-07-16 MED FILL — LOSARTAN POTASSIUM 100 MG T: 100 | 30 days supply | Qty: 30 | Fill #9

## 2019-08-02 MED FILL — SUMATRIPTAN SUCC 100 MG TAB: 100 | 30 days supply | Qty: 6 | Fill #0

## 2019-08-03 MED FILL — ATORVASTATIN 20 MG TABLET: 20 | 30 days supply | Qty: 30 | Fill #9

## 2019-08-03 MED FILL — TAMSULOSIN HCL 0.4 MG CAP: 0.4 | 30 days supply | Qty: 30 | Fill #1

## 2019-08-17 MED FILL — LOSARTAN POTASSIUM 100 MG T: 100 | 30 days supply | Qty: 30 | Fill #10

## 2019-08-30 MED FILL — ATORVASTATIN 20 MG TABLET: 20 | 30 days supply | Qty: 30 | Fill #10

## 2019-08-30 MED FILL — TAMSULOSIN HCL 0.4 MG CAP: 0.4 | 30 days supply | Qty: 30 | Fill #2

## 2019-09-16 MED FILL — LOSARTAN POTASSIUM 100 MG T: 100 | 30 days supply | Qty: 30 | Fill #11

## 2019-09-28 MED FILL — TADALAFIL 10 MG TABS: 10 | 6 days supply | Qty: 6 | Fill #0

## 2019-09-30 MED FILL — ATORVASTATIN 20 MG TABLET: 20 | 30 days supply | Qty: 30 | Fill #11

## 2019-09-30 MED FILL — TAMSULOSIN HCL 0.4 MG CAP: 0.4 | 30 days supply | Qty: 30 | Fill #3

## 2019-10-18 MED FILL — LOSARTAN POTASSIUM 100 MG T: 100 | 30 days supply | Qty: 30 | Fill #0

## 2019-10-28 MED FILL — ATORVASTATIN 20 MG TABLET: 20 | 30 days supply | Qty: 30 | Fill #0

## 2019-11-04 MED FILL — HYDROCHLOROTHIAZIDE 12.5 MG: 12.5 | 30 days supply | Qty: 30 | Fill #0

## 2019-11-11 MED FILL — LOSARTAN POTASSIUM 100 MG T: 100 | 30 days supply | Qty: 30 | Fill #1

## 2019-11-29 MED FILL — ATORVASTATIN 20 MG TABLET: 20 | 30 days supply | Qty: 30 | Fill #1

## 2019-11-29 MED FILL — TAMSULOSIN HCL 0.4 MG CAP: 0.4 | 30 days supply | Qty: 30 | Fill #5

## 2019-12-03 MED FILL — HYDROCHLOROTHIAZIDE 12.5 MG: 12.5 | 30 days supply | Qty: 30 | Fill #1

## 2019-12-15 MED FILL — LOSARTAN POTASSIUM 100 MG T: 100 | 30 days supply | Qty: 30 | Fill #2

## 2019-12-27 MED FILL — ATORVASTATIN 20 MG TABLET: 20 | 30 days supply | Qty: 30 | Fill #2

## 2019-12-27 MED FILL — TAMSULOSIN HCL 0.4 MG CAP: 0.4 | 30 days supply | Qty: 30 | Fill #6

## 2019-12-31 MED FILL — HYDROCHLOROTHIAZIDE 12.5 MG: 12.5 | 30 days supply | Qty: 30 | Fill #2

## 2020-01-17 MED FILL — LOSARTAN POTASSIUM 100 MG T: 100 | 30 days supply | Qty: 30 | Fill #3

## 2020-01-29 MED FILL — ATORVASTATIN 20 MG TABLET: 20 | 30 days supply | Qty: 30 | Fill #3

## 2020-01-29 MED FILL — HYDROCHLOROTHIAZIDE 12.5 MG: 12.5 | 30 days supply | Qty: 30 | Fill #3

## 2020-01-29 MED FILL — TAMSULOSIN HCL 0.4 MG CAP: 0.4 | 30 days supply | Qty: 30 | Fill #7

## 2020-02-05 ENCOUNTER — Ambulatory Visit: Payer: 59 | Attending: Internal Medicine

## 2020-02-05 DIAGNOSIS — Z23 Encounter for immunization: Secondary | ICD-10-CM

## 2020-02-05 NOTE — Progress Notes (Signed)
   Covid-19 Vaccination Clinic  Name:  Robert Mills    MRN: [id number] DOB: 04-27-1966  02/05/2020  Mr. Hergenrader was observed post Covid-19 immunization for 30 minutes based on pre-vaccination screening without incident. He was provided with Vaccine Information Sheet and instruction to access the V-Safe system.   Mr. Verhagen was instructed to call 911 with any severe reactions post vaccine: Marland Kitchen Difficulty breathing  . Swelling of face and throat  . A fast heartbeat  . A bad rash all over body  . Dizziness and weakness   Immunizations Administered    Name Date Dose VIS Date Route   Pfizer COVID-19 Vaccine 02/05/2020  9:55 AM 0.3 mL 10/08/2019 Intramuscular   Manufacturer: [place]   Lot: [id number]   [place]: [id number]

## 2020-02-12 MED FILL — LOSARTAN POTASSIUM 100 MG T: 100 | 30 days supply | Qty: 30 | Fill #4

## 2020-02-25 MED FILL — TAMSULOSIN HCL 0.4 MG CAP: 0.4 | 30 days supply | Qty: 30 | Fill #8

## 2020-02-25 MED FILL — ATORVASTATIN 20 MG TABLET: 20 | 30 days supply | Qty: 30 | Fill #4

## 2020-03-01 ENCOUNTER — Ambulatory Visit: Payer: 59 | Attending: Internal Medicine

## 2020-03-01 DIAGNOSIS — Z23 Encounter for immunization: Secondary | ICD-10-CM

## 2020-03-01 NOTE — Progress Notes (Signed)
   Covid-19 Vaccination Clinic  Name:  Robert Mills    MRN: [id number] DOB: [birth year]  03/01/2020  Mr. Robert Mills was observed post Covid-19 immunization for 15 minutes without incident. He was provided with Vaccine Information Sheet and instruction to access the V-Safe system.   Mr. Robert Mills was instructed to call 911 with any severe reactions post vaccine: Marland Kitchen Difficulty breathing  . Swelling of face and throat  . A fast heartbeat  . A bad rash all over body  . Dizziness and weakness   Immunizations Administered    Name Date Dose VIS Date Route   Pfizer COVID-19 Vaccine 03/01/2020  4:26 PM 0.3 mL 12/22/2018 Intramuscular   Manufacturer: [place]   Lot: [id number]   [place]: [id number]

## 2020-03-18 MED FILL — LOSARTAN POTASSIUM 100 MG T: 100 | 30 days supply | Qty: 30 | Fill #5

## 2020-03-28 MED FILL — TAMSULOSIN HCL 0.4 MG CAP: 0.4 | 30 days supply | Qty: 30 | Fill #9

## 2020-03-28 MED FILL — ATORVASTATIN 20 MG TABLET: 20 | 30 days supply | Qty: 30 | Fill #5

## 2020-04-14 MED FILL — TADALAFIL 10 MG TABS: 10 | 6 days supply | Qty: 6 | Fill #1

## 2020-04-14 MED FILL — LOSARTAN POTASSIUM 100 MG T: 100 | 30 days supply | Qty: 30 | Fill #6

## 2020-04-14 MED FILL — HYDROCHLOROTHIAZIDE 12.5 MG: 12.5 | 30 days supply | Qty: 30 | Fill #5

## 2020-04-28 MED FILL — ATORVASTATIN 20 MG TABLET: 20 | 90 days supply | Qty: 90 | Fill #6

## 2020-04-28 MED FILL — TAMSULOSIN HCL 0.4 MG CAP: 0.4 | 30 days supply | Qty: 30 | Fill #10

## 2020-05-16 MED FILL — LOSARTAN POTASSIUM 100 MG T: 100 | 30 days supply | Qty: 30 | Fill #7

## 2020-06-02 MED FILL — TAMSULOSIN HCL 0.4 MG CAP: 0.4 | 30 days supply | Qty: 30 | Fill #11

## 2020-06-16 MED FILL — LOSARTAN POTASSIUM 100 MG T: 100 | 30 days supply | Qty: 30 | Fill #8

## 2021-11-07 ENCOUNTER — Other Ambulatory Visit (HOSPITAL_COMMUNITY): Payer: Self-pay

## 2021-11-07 MED ORDER — LOSARTAN POTASSIUM 100 MG PO TABS
100.0000 mg | ORAL_TABLET | Freq: Every day | ORAL | 3 refills | Status: DC
  Filled 2021-11-07: qty 90, 90d supply, fill #0
  Filled 2022-02-01: qty 90, 90d supply, fill #1
  Filled 2022-05-07: qty 90, 90d supply, fill #2

## 2021-11-08 ENCOUNTER — Other Ambulatory Visit (HOSPITAL_COMMUNITY): Payer: Self-pay

## 2021-11-08 MED ORDER — TAMSULOSIN HCL 0.4 MG PO CAPS: ORAL_CAPSULE | ORAL | 3 refills | Status: AC

## 2021-11-09 ENCOUNTER — Other Ambulatory Visit (HOSPITAL_COMMUNITY): Payer: Self-pay

## 2021-11-23 ENCOUNTER — Other Ambulatory Visit (HOSPITAL_COMMUNITY): Payer: Self-pay

## 2021-11-23 MED ORDER — AMLODIPINE BESYLATE 2.5 MG PO TABS
ORAL_TABLET | ORAL | 3 refills | Status: DC
  Filled 2021-11-23: qty 30, 30d supply, fill #0

## 2021-11-23 MED ORDER — ATORVASTATIN CALCIUM 40 MG PO TABS
ORAL_TABLET | ORAL | 3 refills | Status: AC
  Filled 2021-11-23: qty 90, 90d supply, fill #0

## 2021-12-11 ENCOUNTER — Other Ambulatory Visit (HOSPITAL_COMMUNITY): Payer: Self-pay

## 2021-12-11 MED ORDER — AMLODIPINE BESYLATE 5 MG PO TABS
ORAL_TABLET | ORAL | 1 refills | Status: DC
  Filled 2021-12-11: qty 90, 90d supply, fill #0
  Filled 2022-03-15: qty 90, 90d supply, fill #1

## 2022-01-07 ENCOUNTER — Other Ambulatory Visit (HOSPITAL_COMMUNITY): Payer: Self-pay

## 2022-01-07 MED ORDER — AMLODIPINE BESYLATE 2.5 MG PO TABS
ORAL_TABLET | ORAL | 1 refills | Status: DC
  Filled 2022-01-07: qty 90, 90d supply, fill #0
  Filled 2022-03-29: qty 90, 90d supply, fill #1

## 2022-01-07 MED ORDER — TAMSULOSIN HCL 0.4 MG PO CAPS
ORAL_CAPSULE | ORAL | 1 refills | Status: AC
  Filled 2022-01-07: qty 90, 90d supply, fill #0
  Filled 2022-04-18: qty 90, 90d supply, fill #1

## 2022-02-01 ENCOUNTER — Other Ambulatory Visit (HOSPITAL_COMMUNITY): Payer: Self-pay

## 2022-03-15 ENCOUNTER — Other Ambulatory Visit (HOSPITAL_COMMUNITY): Payer: Self-pay

## 2022-03-29 ENCOUNTER — Other Ambulatory Visit (HOSPITAL_COMMUNITY): Payer: Self-pay

## 2022-04-18 ENCOUNTER — Other Ambulatory Visit (HOSPITAL_COMMUNITY): Payer: Self-pay

## 2022-05-07 ENCOUNTER — Other Ambulatory Visit (HOSPITAL_COMMUNITY): Payer: Self-pay

## 2022-06-12 ENCOUNTER — Other Ambulatory Visit (HOSPITAL_COMMUNITY): Payer: Self-pay

## 2022-06-13 ENCOUNTER — Other Ambulatory Visit (HOSPITAL_COMMUNITY): Payer: Self-pay

## 2022-06-13 MED ORDER — AMLODIPINE BESYLATE 5 MG PO TABS
ORAL_TABLET | ORAL | 3 refills | Status: DC
  Filled 2022-06-13: qty 90, 90d supply, fill #0

## 2022-07-05 ENCOUNTER — Other Ambulatory Visit (HOSPITAL_COMMUNITY): Payer: Self-pay

## 2022-07-05 MED ORDER — AMLODIPINE BESYLATE 2.5 MG PO TABS
2.5000 mg | ORAL_TABLET | Freq: Every day | ORAL | 3 refills | Status: DC
  Filled 2022-07-05: qty 90, 90d supply, fill #0
  Filled 2022-09-11: qty 90, 90d supply, fill #1

## 2022-07-18 ENCOUNTER — Other Ambulatory Visit (HOSPITAL_COMMUNITY): Payer: Self-pay

## 2022-07-18 MED ORDER — TAMSULOSIN HCL 0.4 MG PO CAPS
0.4000 mg | ORAL_CAPSULE | Freq: Every day | ORAL | 3 refills | Status: AC
  Filled 2022-07-18: qty 90, 90d supply, fill #0

## 2022-08-01 ENCOUNTER — Other Ambulatory Visit (HOSPITAL_COMMUNITY): Payer: Self-pay

## 2022-08-01 MED ORDER — LOSARTAN POTASSIUM 100 MG PO TABS
100.0000 mg | ORAL_TABLET | Freq: Every day | ORAL | 3 refills | Status: DC
  Filled 2022-08-01: qty 90, 90d supply, fill #0

## 2022-09-10 ENCOUNTER — Other Ambulatory Visit (HOSPITAL_COMMUNITY): Payer: Self-pay

## 2022-09-10 MED ORDER — AMLODIPINE BESYLATE 2.5 MG PO TABS
2.5000 mg | ORAL_TABLET | Freq: Every day | ORAL | 3 refills | Status: DC
  Filled 2022-09-10 – 2022-10-01 (×2): qty 90, 90d supply, fill #0

## 2022-09-10 MED ORDER — AMLODIPINE BESYLATE 5 MG PO TABS
5.0000 mg | ORAL_TABLET | Freq: Every day | ORAL | 3 refills | Status: DC
  Filled 2022-09-10: qty 90, 90d supply, fill #0

## 2022-09-11 ENCOUNTER — Other Ambulatory Visit (HOSPITAL_COMMUNITY): Payer: Self-pay

## 2022-10-01 ENCOUNTER — Other Ambulatory Visit (HOSPITAL_COMMUNITY): Payer: Self-pay

## 2022-10-02 ENCOUNTER — Other Ambulatory Visit (HOSPITAL_COMMUNITY): Payer: Self-pay

## 2022-10-16 ENCOUNTER — Other Ambulatory Visit (HOSPITAL_COMMUNITY): Payer: Self-pay

## 2022-10-16 MED ORDER — TAMSULOSIN HCL 0.4 MG PO CAPS
0.4000 mg | ORAL_CAPSULE | Freq: Every day | ORAL | 3 refills | Status: AC
  Filled 2022-10-16: qty 90, 90d supply, fill #0

## 2022-11-04 ENCOUNTER — Other Ambulatory Visit (HOSPITAL_COMMUNITY): Payer: Self-pay

## 2022-11-04 MED ORDER — LOSARTAN POTASSIUM 100 MG PO TABS
ORAL_TABLET | ORAL | 3 refills | Status: DC
  Filled 2022-11-04: qty 90, 90d supply, fill #0

## 2022-12-02 ENCOUNTER — Other Ambulatory Visit (HOSPITAL_COMMUNITY): Payer: Self-pay

## 2022-12-02 ENCOUNTER — Other Ambulatory Visit: Payer: Self-pay | Admitting: Internal Medicine

## 2022-12-02 DIAGNOSIS — Z0000 Encounter for general adult medical examination without abnormal findings: Secondary | ICD-10-CM

## 2022-12-02 MED ORDER — DULOXETINE HCL 30 MG PO CPEP
30.0000 mg | ORAL_CAPSULE | Freq: Every day | ORAL | 5 refills | Status: AC
  Filled 2022-12-02: qty 30, 30d supply, fill #0

## 2022-12-03 ENCOUNTER — Other Ambulatory Visit (HOSPITAL_COMMUNITY): Payer: Self-pay

## 2022-12-13 ENCOUNTER — Other Ambulatory Visit (HOSPITAL_COMMUNITY): Payer: Self-pay

## 2022-12-13 MED ORDER — AMLODIPINE BESYLATE 5 MG PO TABS
5.0000 mg | ORAL_TABLET | Freq: Every day | ORAL | 3 refills | Status: DC
  Filled 2022-12-13: qty 90, 90d supply, fill #0

## 2022-12-13 MED ORDER — AMLODIPINE BESYLATE 2.5 MG PO TABS
2.5000 mg | ORAL_TABLET | Freq: Every day | ORAL | 3 refills | Status: DC
  Filled 2022-12-13: qty 90, 90d supply, fill #0

## 2023-01-01 ENCOUNTER — Other Ambulatory Visit (HOSPITAL_COMMUNITY): Payer: Self-pay

## 2023-01-01 MED ORDER — DULOXETINE HCL 30 MG PO CPEP
30.0000 mg | ORAL_CAPSULE | Freq: Every day | ORAL | 5 refills | Status: AC
  Filled 2023-01-01: qty 90, 90d supply, fill #0

## 2023-01-01 MED ORDER — AMLODIPINE BESYLATE 2.5 MG PO TABS
2.5000 mg | ORAL_TABLET | Freq: Every day | ORAL | 3 refills | Status: DC
  Filled 2023-01-01: qty 90, 90d supply, fill #0

## 2023-01-17 ENCOUNTER — Other Ambulatory Visit (HOSPITAL_COMMUNITY): Payer: Self-pay

## 2023-01-17 MED ORDER — TAMSULOSIN HCL 0.4 MG PO CAPS
0.4000 mg | ORAL_CAPSULE | ORAL | 3 refills | Status: AC
  Filled 2023-01-17: qty 90, 90d supply, fill #0

## 2023-02-06 ENCOUNTER — Other Ambulatory Visit (HOSPITAL_COMMUNITY): Payer: Self-pay

## 2023-02-06 MED ORDER — LOSARTAN POTASSIUM 100 MG PO TABS
ORAL_TABLET | ORAL | 3 refills | Status: DC
  Filled 2023-02-06: qty 90, 90d supply, fill #0

## 2023-03-14 ENCOUNTER — Other Ambulatory Visit (HOSPITAL_COMMUNITY): Payer: Self-pay

## 2023-03-14 MED ORDER — AMLODIPINE BESYLATE 2.5 MG PO TABS
2.5000 mg | ORAL_TABLET | Freq: Every day | ORAL | 3 refills | Status: DC
  Filled 2023-03-14: qty 90, 90d supply, fill #0

## 2023-03-14 MED ORDER — AMLODIPINE BESYLATE 5 MG PO TABS
5.0000 mg | ORAL_TABLET | Freq: Every day | ORAL | 3 refills | Status: DC
  Filled 2023-03-14: qty 90, 90d supply, fill #0

## 2023-03-28 ENCOUNTER — Other Ambulatory Visit: Payer: Self-pay

## 2023-03-28 ENCOUNTER — Other Ambulatory Visit (HOSPITAL_COMMUNITY): Payer: Self-pay

## 2023-03-28 MED ORDER — DULOXETINE HCL 30 MG PO CPEP
30.0000 mg | ORAL_CAPSULE | Freq: Every day | ORAL | 3 refills | Status: AC
  Filled 2023-03-28: qty 90, 90d supply, fill #0

## 2023-03-28 MED ORDER — AMLODIPINE BESYLATE 2.5 MG PO TABS
2.5000 mg | ORAL_TABLET | ORAL | 3 refills | Status: DC
  Filled 2023-03-28: qty 90, 90d supply, fill #0

## 2023-04-03 ENCOUNTER — Other Ambulatory Visit (HOSPITAL_COMMUNITY): Payer: Self-pay

## 2023-04-03 MED ORDER — AMLODIPINE BESYLATE 10 MG PO TABS
10.0000 mg | ORAL_TABLET | Freq: Every day | ORAL | 3 refills | Status: AC
  Filled 2023-04-03: qty 90, 90d supply, fill #0

## 2023-04-03 MED ORDER — IRBESARTAN 300 MG PO TABS
300.0000 mg | ORAL_TABLET | Freq: Every day | ORAL | 3 refills | Status: AC
  Filled 2023-04-03: qty 90, 90d supply, fill #0

## 2023-04-16 ENCOUNTER — Other Ambulatory Visit (HOSPITAL_COMMUNITY): Payer: Self-pay

## 2023-04-16 MED ORDER — TAMSULOSIN HCL 0.4 MG PO CAPS
0.4000 mg | ORAL_CAPSULE | Freq: Every day | ORAL | 3 refills | Status: AC
  Filled 2023-04-16: qty 90, 90d supply, fill #0
  Filled 2023-07-18: qty 90, 90d supply, fill #1

## 2023-05-06 ENCOUNTER — Encounter: Payer: Self-pay | Admitting: *Deleted

## 2023-05-07 ENCOUNTER — Encounter: Payer: Self-pay | Admitting: Neurology

## 2023-05-07 ENCOUNTER — Ambulatory Visit: Payer: Managed Care, Other (non HMO) | Admitting: Neurology

## 2023-05-07 VITALS — Ht 73.0 in | Wt 289.0 lb

## 2023-05-07 DIAGNOSIS — R519 Headache, unspecified: Secondary | ICD-10-CM

## 2023-05-07 DIAGNOSIS — R0689 Other abnormalities of breathing: Secondary | ICD-10-CM

## 2023-05-07 DIAGNOSIS — G4719 Other hypersomnia: Secondary | ICD-10-CM

## 2023-05-07 DIAGNOSIS — E669 Obesity, unspecified: Secondary | ICD-10-CM | POA: Diagnosis not present

## 2023-05-07 DIAGNOSIS — Z9189 Other specified personal risk factors, not elsewhere classified: Secondary | ICD-10-CM

## 2023-05-07 DIAGNOSIS — R351 Nocturia: Secondary | ICD-10-CM

## 2023-05-07 DIAGNOSIS — R0683 Snoring: Secondary | ICD-10-CM

## 2023-05-07 NOTE — Progress Notes (Signed)
Subjective:    Patient ID: Robert Mills is a 57 y.o. male.  HPI    Huston Foley, MD, PhD [facility] [address] [address] [place], [place] [address]  Dear Dr. Timothy Lasso,  I saw your patient, Robert Mills, upon your kind request in my sleep clinic today for initial consultation of his sleep disorder, in particular, concern for underlying obstructive sleep apnea.  The patient is unaccompanied today.  As you know, Robert Mills is a 57 year old male with an underlying medical history of hypertension, hyperlipidemia, migraine headaches, reflux disease, nephrolithiasis, rosacea, and obesity, who reports snoring and excessive daytime somnolence.  He has occasionally woken up with a sense of gasping for air.  This happens typically when he sleeps on his back.  He prefers to sleep on his left side.  His Epworth sleepiness score is 18 out of 24, fatigue severity score is 39 out of 63.  He has been witnessed to have apneic pauses per wife.  His snoring can be disturbing.  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  His bedtime is generally around 10:30 PM or 11 PM and rise time between 4:30 AM and 5 AM.  He does not typically watch TV while in bed.  He has 2 dogs in the household, lives with his wife and their adult son lives with him.  He has a daughter in college at [employer].  I reviewed your office note from 04/03/2023. He is a non-smoker.  He drinks alcohol occasionally, he has caffeine in the form of coffee, usually 1 cup/day.  His weight has been more or less stable.  He has nocturia about 2-3 times per average night and has had occasional morning headaches which are described as dull and achy, in the posterior areas.  His Past Medical History Is Significant For: Past Medical History:  Diagnosis Date   Allergy    SEASONAL   GERD (gastroesophageal reflux disease)    Hyperlipidemia    Hypertension    Migraines    Nephrolithiasis     Obesity    Plantar fasciitis    Pneumonia    hx of in childhood    Rosacea     His Past Surgical History Is Significant For: Past Surgical History:  Procedure Laterality Date   compound fx     left leg at age 33   INSERTION OF MESH N/A 09/16/2014   Procedure: INSERTION OF MESH;  Surgeon: Atilano Ina, MD;  Location: [facility];  Service: General;  Laterality: N/A;   UMBILICAL HERNIA REPAIR N/A 09/16/2014   Procedure: LAP ASSISTED UMBILICAL HERNIA REPAIR WITH MESH;  Surgeon: Atilano Ina, MD;  Location: [facility];  Service: General;  Laterality: N/A;   VASECTOMY     WISDOM TOOTH EXTRACTION     bottom bilat     His Family History Is Significant For: Family History  Problem Relation Age of Onset   Diabetes Mother    Congestive Heart Failure Mother    Kidney disease Mother    Depression Mother    Hyperlipidemia Father    Diabetes Maternal Grandfather    Heart disease Maternal Grandfather    Colon cancer Other        Great Grandmother   Sleep apnea Neg Hx     His Social History Is Significant For: Social History   Socioeconomic History   Marital status: Married    Spouse name: Not  on file   Number of children: Not on file   Years of education: Not on file   Highest education level: Not on file  Occupational History   Not on file  Tobacco Use   Smoking status: Former    Packs/day: 0.50    Years: 10.00    Additional pack years: 0.00    Total pack years: 5.00    Types: Cigarettes   Smokeless tobacco: Never  Vaping Use   Vaping Use: Never used  Substance and Sexual Activity   Alcohol use: Yes    Alcohol/week: 0.0 standard drinks of alcohol    Comment: occasional   Drug use: No   Sexual activity: Not on file  Other Topics Concern   Not on file  Social History Narrative   Right handed   Caffeine: 1 cup coffee in the morning usually on the weekends   Social Determinants of Health   Financial Resource Strain: Not on file  Food Insecurity: Not on file   Transportation Needs: Not on file  Physical Activity: Not on file  Stress: Not on file  Social Connections: Not on file    His Allergies Are:  Allergies  Allergen Reactions   Other Swelling    Had a reaction to anesthesia with hernia surgery but cannot recall which drug it was. ?? Propofol but not sure   Had swelling and redness  :   His Current Medications Are:  Outpatient Encounter Medications as of 05/07/2023  Medication Sig   amLODipine (NORVASC) 10 MG tablet Take 1 tablet (10 mg total) by mouth daily.   atorvastatin (LIPITOR) 40 MG tablet Take 1 tablet by mouth daily.   DULoxetine (CYMBALTA) 30 MG capsule Take 1 capsule (30 mg total) by mouth daily.   DULoxetine (CYMBALTA) 30 MG capsule Take 1 capsule (30 mg total) by mouth daily.   DULoxetine (CYMBALTA) 30 MG capsule Take 1 capsule (30 mg total) by mouth daily for 90 days.   ibuprofen (ADVIL,MOTRIN) 200 MG tablet Take 600 mg by mouth every 6 (six) hours as needed for headache or moderate pain.   irbesartan (AVAPRO) 300 MG tablet Take 1 tablet (300 mg total) by mouth daily. (stop Losartan)   loratadine (CLARITIN) 10 MG tablet Take 10 mg by mouth daily as needed for allergies.   Omega-3 Fatty Acids (FISH OIL PO) Take 565 mg by mouth. Occasionally   Probiotic Product (PROBIOTIC PO) Take 1 tablet by mouth every morning. Occasionally   sodium chloride (OCEAN) 0.65 % SOLN nasal spray Place 1-2 sprays into both nostrils daily as needed for congestion.   SUMAtriptan (IMITREX) 100 MG tablet Take 100 mg by mouth every 2 (two) hours as needed for migraine. May repeat in 2 hours if headache persists or recurs.   tamsulosin (FLOMAX) 0.4 MG CAPS capsule Take 1 capsule by mouth daily   tamsulosin (FLOMAX) 0.4 MG CAPS capsule Take 1 capsule by mouth once daily   tamsulosin (FLOMAX) 0.4 MG CAPS capsule Take 1 capsule (0.4 mg total) by mouth daily as directed.   tamsulosin (FLOMAX) 0.4 MG CAPS capsule Take 1 capsule (0.4 mg total) by mouth  daily as directed.   tamsulosin (FLOMAX) 0.4 MG CAPS capsule Take 1 capsule (0.4 mg total) by mouth daily as directed.   tamsulosin (FLOMAX) 0.4 MG CAPS capsule Take 1 capsule (0.4 mg total) by mouth daily.   [DISCONTINUED] amLODipine (NORVASC) 2.5 MG tablet Take 1 tablet by mouth once daily. (Patient not taking: Reported on 05/07/2023)   [  DISCONTINUED] amLODipine (NORVASC) 2.5 MG tablet Take 1 tablet (along with a 5mg  tablet) once daily for a total daily dose of 7.5mg  (Patient not taking: Reported on 05/07/2023)   [DISCONTINUED] amLODipine (NORVASC) 2.5 MG tablet Take 1 tablet (2.5 mg total) by mouth daily with the 5 mg tablet to equal 7.5 mg (Patient not taking: Reported on 05/07/2023)   [DISCONTINUED] amLODipine (NORVASC) 2.5 MG tablet Take 1 tablet (2.5 mg total) by mouth daily. (Take with 5mg  tablet) (Patient not taking: Reported on 05/07/2023)   [DISCONTINUED] amLODipine (NORVASC) 2.5 MG tablet Take 1 tablet (2.5 mg total) by mouth daily along with 5mg  to equal 7.5mg  dosage as directed (Patient not taking: Reported on 05/07/2023)   [DISCONTINUED] amLODipine (NORVASC) 2.5 MG tablet Take 1 tablet (2.5 mg total) by mouth daily with 5mg  tablet. (Patient not taking: Reported on 05/07/2023)   [DISCONTINUED] amLODipine (NORVASC) 2.5 MG tablet Take 1 tablet (2.5 mg total) by mouth daily with 5mg  to equal 7.5mg  dosage (Patient not taking: Reported on 05/07/2023)   [DISCONTINUED] amLODipine (NORVASC) 2.5 MG tablet Take 1 tablet by mouth daily along with 5mg  to equal 7.5 mg as directed for 90 days. (Patient not taking: Reported on 05/07/2023)   [DISCONTINUED] amLODipine (NORVASC) 5 MG tablet Take 1 tablet by mouth daily with 2.5 MG to equal 7.5 MG (Patient not taking: Reported on 05/07/2023)   [DISCONTINUED] amLODipine (NORVASC) 5 MG tablet Take 1 tablet by mouth daily. (Take with 2.5mg  to equal 7.5mg  daily) (Patient not taking: Reported on 05/07/2023)   [DISCONTINUED] amLODipine (NORVASC) 5 MG tablet Take 1 tablet (5  mg total) by mouth daily along with 2.5mg  to equal 7.5mg  daily as directed (Patient not taking: Reported on 05/07/2023)   [DISCONTINUED] amLODipine (NORVASC) 5 MG tablet Take 1 tablet (5 mg total) by mouth daily with Rx of 2.5mg  to equal 7.5mg  daily as directed (Patient not taking: Reported on 05/07/2023)   [DISCONTINUED] losartan (COZAAR) 100 MG tablet Take 1 tablet (100 mg total) by mouth daily. (Patient not taking: Reported on 05/07/2023)   [DISCONTINUED] losartan (COZAAR) 100 MG tablet Take 1 tablet (100 mg total) by mouth daily. (Patient not taking: Reported on 05/07/2023)   [DISCONTINUED] losartan (COZAAR) 100 MG tablet TAKE 1 TABLET BY MOUTH DAILY (Patient not taking: Reported on 05/07/2023)   [DISCONTINUED] losartan (COZAAR) 100 MG tablet TAKE ONE TABLET BY MOUTH DAILY (Patient not taking: Reported on 05/07/2023)   [DISCONTINUED] Multiple Vitamin (MULTIVITAMIN WITH MINERALS) TABS tablet Take 1 tablet by mouth every morning. (Patient not taking: Reported on 05/07/2023)   No facility-administered encounter medications on file as of 05/07/2023.  :   Review of Systems:  Out of a complete 14 point review of systems, all are reviewed and negative with the exception of these symptoms as listed below:  Review of Systems  Neurological:        Patient is here alone for sleep consult. He states it is rare for him to have a full or good night's sleep. He is tired during the day. Once he is up and going, if he keeps moving, he's ok. However if he sits down to relax he will fall asleep. He is also told he snores. He sleeps more comfortably on his side but when he sleeps on his side he will wake up gasping for air.     Objective:  Neurological Exam  Physical Exam Physical Examination:   There were no vitals filed for this visit.  General Examination: The patient is a very  pleasant 57 y.o. male in no acute distress. He appears well-developed and well-nourished and well groomed.   HEENT: Normocephalic,  atraumatic, pupils are equal, round and reactive to light, extraocular tracking is good without limitation to gaze excursion or nystagmus noted. Hearing is grossly intact. Face is symmetric with normal facial animation. Speech is clear with no dysarthria noted. There is no hypophonia. There is no lip, neck/head, jaw or voice tremor. Neck is supple with full range of passive and active motion. There are no carotid bruits on auscultation. Oropharynx exam reveals: No significant mouth dryness, good dental hygiene, moderate airway crowding secondary to thicker soft palate, tonsillar size of 1-2+ on the right and 1+ on the left, prominent uvula, Mallampati class I, mild overbite noted.  Tongue protrudes centrally and palate elevates symmetrically.  Neck circumference 18-1/2 inches.    Chest: Clear to auscultation without wheezing, rhonchi or crackles noted.  Heart: S1+S2+0, regular and normal without murmurs, rubs or gallops noted.   Abdomen: Soft, non-tender and non-distended.  Extremities: There is no pitting edema in the distal lower extremities bilaterally.   Skin: Warm and dry without trophic changes noted.   Musculoskeletal: exam reveals no obvious joint deformities.   Neurologically:  Mental status: The patient is awake, alert and oriented in all 4 spheres. His immediate and remote memory, attention, language skills and fund of knowledge are appropriate. There is no evidence of aphasia, agnosia, apraxia or anomia. Speech is clear with normal prosody and enunciation. Thought process is linear. Mood is normal and affect is normal.  Cranial nerves II - XII are as described above under HEENT exam.  Motor exam: Normal bulk, strength and tone is noted. There is no obvious action or resting tremor.  Fine motor skills and coordination: grossly intact.  Cerebellar testing: No dysmetria or intention tremor. There is no truncal or gait ataxia.  Sensory exam: intact to light touch in the upper and lower  extremities.  Gait, station and balance: He stands easily. No veering to one side is noted. No leaning to one side is noted. Posture is age-appropriate and stance is narrow based. Gait shows normal stride length and normal pace. No problems turning are noted.   Assessment and Plan:  In summary, Robert Mills is a very pleasant 57 y.o.-year old male with an underlying medical history of hypertension, hyperlipidemia, migraine headaches, reflux disease, nephrolithiasis, rosacea, and obesity, whose history and physical exam are concerning for sleep disordered breathing, particularly obstructive sleep apnea (OSA). While a laboratory attended sleep study is typically considered "gold standard" for evaluation of sleep disordered breathing, we mutually agreed to pursue a home sleep test at this time.   I had a long chat with the patient about my findings and the diagnosis of sleep apnea, particularly OSA, its prognosis and treatment options. We talked about medical/conservative treatments, surgical interventions and non-pharmacological approaches for symptom control. I explained, in particular, the risks and ramifications of untreated moderate to severe OSA, especially with respect to developing cardiovascular disease down the road, including congestive heart failure (CHF), difficult to treat hypertension, cardiac arrhythmias (particularly A-fib), neurovascular complications including TIA, stroke and dementia. Even type 2 diabetes has, in part, been linked to untreated OSA. Symptoms of untreated OSA may include (but may not be limited to) daytime sleepiness, nocturia (i.e. frequent nighttime urination), memory problems, mood irritability and suboptimally controlled or worsening mood disorder such as depression and/or anxiety, lack of energy, lack of motivation, physical discomfort, as well as recurrent headaches,  especially morning or nocturnal headaches. We talked about the importance of maintaining a healthy  lifestyle and striving for healthy weight.  In addition, we talked about the importance of striving for and maintaining good sleep hygiene. I recommended a sleep study at this time. I outlined the differences between a laboratory attended sleep study which is considered more comprehensive and accurate over the option of a home sleep test (HST); the latter may lead to underestimation of sleep disordered breathing in some instances and does not help with diagnosing upper airway resistance syndrome and is not accurate enough to diagnose primary central sleep apnea typically. I outlined possible surgical and non-surgical treatment options of OSA, including the use of a positive airway pressure (PAP) device (i.e. CPAP, AutoPAP/APAP or BiPAP in certain circumstances), a custom-made dental device (aka oral appliance, which would require a referral to a specialist dentist or orthodontist typically, and is generally speaking not considered for patients with full dentures or edentulous state), upper airway surgical options, such as traditional UPPP (which is not considered a first-line treatment) or the Inspire device (hypoglossal nerve stimulator, which would involve a referral for consultation with an ENT surgeon, after careful selection, following inclusion criteria - also not first-line treatment). I explained the PAP treatment option to the patient in detail, as this is generally considered first-line treatment.  The patient indicated that he would be willing to try PAP therapy, if the need arises. I explained the importance of being compliant with PAP treatment, not only for insurance purposes but primarily to improve patient's symptoms symptoms, and for the patient's long term health benefit, including to reduce His cardiovascular risks longer-term.    We will pick up our discussion about the next steps and treatment options after testing.  We will keep him posted as to the test results by phone call and/or MyChart  messaging where possible.  We will plan to follow-up in sleep clinic accordingly as well.  I answered all his questions today and the patient was in agreement.   I encouraged him to call with any interim questions, concerns, problems or updates or email [place] through MyChart.  Generally speaking, sleep test authorizations may take up to 2 weeks, sometimes less, sometimes longer, the patient is encouraged to get in touch with [place] if they do not hear back from the sleep lab staff directly within the next 2 weeks.  Thank you very much for allowing me to participate in the care of this nice patient. If I can be of any further assistance to you please do not hesitate to call me at [phone].  Sincerely,   Huston Foley, MD, PhD

## 2023-05-07 NOTE — Patient Instructions (Signed)

## 2023-05-13 ENCOUNTER — Ambulatory Visit: Payer: Managed Care, Other (non HMO) | Admitting: Neurology

## 2023-05-13 DIAGNOSIS — G4719 Other hypersomnia: Secondary | ICD-10-CM

## 2023-05-13 DIAGNOSIS — E669 Obesity, unspecified: Secondary | ICD-10-CM

## 2023-05-13 DIAGNOSIS — R351 Nocturia: Secondary | ICD-10-CM

## 2023-05-13 DIAGNOSIS — G4733 Obstructive sleep apnea (adult) (pediatric): Secondary | ICD-10-CM

## 2023-05-13 DIAGNOSIS — R0689 Other abnormalities of breathing: Secondary | ICD-10-CM

## 2023-05-13 DIAGNOSIS — G4734 Idiopathic sleep related nonobstructive alveolar hypoventilation: Secondary | ICD-10-CM

## 2023-05-13 DIAGNOSIS — R0683 Snoring: Secondary | ICD-10-CM

## 2023-05-13 DIAGNOSIS — R519 Headache, unspecified: Secondary | ICD-10-CM

## 2023-05-13 DIAGNOSIS — Z9189 Other specified personal risk factors, not elsewhere classified: Secondary | ICD-10-CM

## 2023-05-13 DIAGNOSIS — G4731 Primary central sleep apnea: Secondary | ICD-10-CM

## 2023-05-21 NOTE — Progress Notes (Unsigned)
See procedure note.

## 2023-05-22 ENCOUNTER — Telehealth: Payer: Self-pay | Admitting: *Deleted

## 2023-05-22 NOTE — Addendum Note (Signed)
Addended by: Huston Foley on: 05/22/2023 11:58 AM   Modules accepted: Orders

## 2023-05-22 NOTE — Telephone Encounter (Addendum)
Spoke to patient  gave sleep study results pt chose [facility] for DME Pt aware of insurance compliance Gave pt [facility] # informed pt he was a urgent set due to his severe OSA., Pt expressed understanding  Faxed over orders to [facility] and sent sleep results to PCP this afternoon Pt thanked me for calling

## 2023-05-22 NOTE — Procedures (Signed)
[facility]  HOME SLEEP TEST (Watch PAT) REPORT - Mail-out Device  STUDY DATE: 05/19/23  DOB: [birth year]  MRN: [id number]  ORDERING CLINICIAN: Huston Foley, MD, PhD   REFERRING CLINICIAN: Creola Corn, MD   CLINICAL INFORMATION/HISTORY: 57 year old male with an underlying medical history of hypertension, hyperlipidemia, migraine headaches, reflux disease, nephrolithiasis, rosacea, and obesity, who reports snoring and excessive daytime somnolence, as well as witnessed apneas. He has occasionally woken up with a sense of gasping for air.   Epworth sleepiness score: 18/24.  BMI: 38.3 kg/m  FINDINGS:   Sleep Summary:   Total Recording Time (hours, min): 8 hours, 29 min  Total Sleep Time (hours, min):  7 hours, 38 min  Percent REM (%):    28.8%   Respiratory Indices:   Calculated pAHI (per hour):  53.2/hour         REM pAHI:    59.6/hour       NREM pAHI: 50.6/hour  Central pAHI: 11.9/hour  Oxygen Saturation Statistics:    Oxygen Saturation (%) Mean: 92%   Minimum oxygen saturation (%):                 71%   O2 Saturation Range (%): 71 - 98%    O2 Saturation (minutes) <=88%: 31.1 min  Pulse Rate Statistics:   Pulse Mean (bpm):    73/min    Pulse Range (52 - 113/min)   IMPRESSION: OSA (obstructive sleep apnea), severe Central Sleep Apnea  Nocturnal Hypoxemia  RECOMMENDATION:  This home sleep test demonstrates severe obstructive sleep apnea with a total AHI of 53.2/hour and O2 nadir of 71% with significant time below or at 88% saturation of over 30 minutes for the night, indicating nocturnal hypoxemia.  There was a milder central apnea component as well.  Snoring was consistently in the moderate to loud range throughout the study. Treatment with positive airway pressure is highly recommended. The patient will be advised to proceed with an autoPAP titration/trial at home. A laboratory attended titration study can be considered in the future for  optimization of treatment settings and to improve tolerance and compliance. Alternative treatment options are limited secondary to the severity of the patient's sleep disordered breathing, but may include surgical treatment with an implantable hypoglossal nerve stimulator (in carefully selected candidates, meeting criteria).  Concomitant weight loss is recommended, where clinically appropriate. Please note, that untreated obstructive sleep apnea may carry additional perioperative morbidity. Patients with significant obstructive sleep apnea should receive perioperative PAP therapy and the surgeons and particularly the anesthesiologist should be informed of the diagnosis and the severity of the sleep disordered breathing. The patient should be cautioned not to drive, work at heights, or operate dangerous or heavy equipment when tired or sleepy. Review and reiteration of good sleep hygiene measures should be pursued with any patient. Other causes of the patient's symptoms, including circadian rhythm disturbances, an underlying mood disorder, medication effect and/or an underlying medical problem cannot be ruled out based on this test. Clinical correlation is recommended.  The patient and his referring provider will be notified of the test results. The patient will be seen in follow up in sleep clinic at [facility].  I certify that I have reviewed the raw data recording prior to the issuance of this report in accordance with the standards of the American Academy of Sleep Medicine (AASM).    INTERPRETING PHYSICIAN:   Huston Foley, MD, PhD Medical Director, [facility]) Diplomat, ABPN (Neurology and  Sleep)   [facility] [address], [address] [place], [place] [address] [phone]

## 2023-05-22 NOTE — Telephone Encounter (Signed)
-----   Message from Huston Foley sent at 05/22/2023 11:58 AM EDT ----- Urgent set up requested on PAP therapy, due to severe OSA. Patient referred by PCP, seen by me on 05/07/2023, patient had a HST on 05/19/2023.    Please call and notify the patient that the recent home sleep test showed obstructive sleep apnea in the severe range. I recommend treatment for this in the form of autoPAP, which means, that we don't have to bring him in for a sleep study with CPAP, but will let him start using a so called autoPAP machine at home, through a DME company (of his choice, or as per insurance requirement). The DME representative will fit the patient with a mask of choice, educate him on how to use the machine, how to put the mask on, etc. I have placed an order in the chart. Please send the order to a local DME, talk to patient, send report to referring MD. Please also reinforce the need for compliance with treatment. We will need a FU in sleep clinic for 10 weeks post-PAP set up, please arrange that with me or one of our NPs. Thanks,   Huston Foley, MD, PhD [facility])

## 2023-07-01 ENCOUNTER — Other Ambulatory Visit (HOSPITAL_COMMUNITY): Payer: Self-pay

## 2023-07-01 MED ORDER — AMLODIPINE BESYLATE 10 MG PO TABS
10.0000 mg | ORAL_TABLET | Freq: Every day | ORAL | 3 refills | Status: AC
  Filled 2023-07-01: qty 90, 90d supply, fill #0

## 2023-07-01 MED ORDER — IRBESARTAN 300 MG PO TABS
300.0000 mg | ORAL_TABLET | Freq: Every day | ORAL | 3 refills | Status: AC
  Filled 2023-07-01: qty 90, 90d supply, fill #0

## 2023-07-01 MED ORDER — DULOXETINE HCL 30 MG PO CPEP
30.0000 mg | ORAL_CAPSULE | Freq: Every day | ORAL | 3 refills | Status: AC
  Filled 2023-07-01: qty 90, 90d supply, fill #0

## 2023-07-02 ENCOUNTER — Other Ambulatory Visit (HOSPITAL_COMMUNITY): Payer: Self-pay

## 2023-08-14 ENCOUNTER — Telehealth: Payer: Self-pay | Admitting: Neurology

## 2023-08-14 NOTE — Telephone Encounter (Signed)
Pt called to request a new initial CPAP f/u appointment due to conflict in schedule.  Pt does not recall the date he started his CPAP, but is certain it has not been over a month.  Pt has been placed on schedule of Megan, NP he is aware to bring CPAP and power cord, this is FYI to pod, no call back requested

## 2023-08-14 NOTE — Telephone Encounter (Signed)
Ok thanks. [facility] says his setup date was 05/29/23 so his window for follow-up is 06/29/23 - 08/29/23.

## 2023-08-18 ENCOUNTER — Encounter: Payer: Managed Care, Other (non HMO) | Admitting: Adult Health

## 2023-08-27 ENCOUNTER — Ambulatory Visit: Payer: Managed Care, Other (non HMO) | Admitting: Adult Health

## 2023-08-27 ENCOUNTER — Encounter: Payer: Self-pay | Admitting: Adult Health

## 2023-08-27 VITALS — BP 143/75 | HR 80 | Ht 73.0 in | Wt 292.2 lb

## 2023-08-27 DIAGNOSIS — G4733 Obstructive sleep apnea (adult) (pediatric): Secondary | ICD-10-CM

## 2023-08-27 NOTE — Patient Instructions (Signed)
Continue using CPAP nightly and greater than 4 hours each night °If your symptoms worsen or you develop new symptoms please let us know.  ° °

## 2023-08-28 ENCOUNTER — Other Ambulatory Visit: Payer: Self-pay

## 2023-08-28 ENCOUNTER — Other Ambulatory Visit (HOSPITAL_COMMUNITY): Payer: Self-pay

## 2023-08-28 MED ORDER — ILEVRO 0.3 % OP SUSP
1.0000 [drp] | Freq: Every day | OPHTHALMIC | 0 refills | Status: AC
  Filled 2023-08-28: qty 3, 60d supply, fill #0

## 2023-08-28 MED ORDER — KETOROLAC TROMETHAMINE 0.4 % OP SOLN
1.0000 [drp] | Freq: Three times a day (TID) | OPHTHALMIC | 0 refills | Status: AC
  Filled 2023-08-28: qty 5, 34d supply, fill #0

## 2023-08-29 ENCOUNTER — Other Ambulatory Visit (HOSPITAL_COMMUNITY): Payer: Self-pay

## 2023-09-01 ENCOUNTER — Other Ambulatory Visit (HOSPITAL_COMMUNITY): Payer: Self-pay

## 2023-09-24 ENCOUNTER — Other Ambulatory Visit: Payer: Self-pay

## 2023-09-24 ENCOUNTER — Other Ambulatory Visit (HOSPITAL_COMMUNITY): Payer: Self-pay

## 2023-09-24 MED ORDER — DULOXETINE HCL 30 MG PO CPEP
30.0000 mg | ORAL_CAPSULE | Freq: Every day | ORAL | 3 refills | Status: AC
  Filled 2023-09-24: qty 90, 90d supply, fill #0

## 2023-09-24 MED ORDER — AMLODIPINE BESYLATE 10 MG PO TABS
10.0000 mg | ORAL_TABLET | Freq: Every day | ORAL | 3 refills | Status: AC
  Filled 2023-09-24: qty 90, 90d supply, fill #0

## 2023-10-02 ENCOUNTER — Other Ambulatory Visit (HOSPITAL_COMMUNITY): Payer: Self-pay

## 2023-10-02 MED ORDER — IRBESARTAN 300 MG PO TABS
300.0000 mg | ORAL_TABLET | Freq: Every day | ORAL | 3 refills | Status: AC
  Filled 2023-10-02: qty 90, 90d supply, fill #0

## 2023-10-03 ENCOUNTER — Other Ambulatory Visit (HOSPITAL_COMMUNITY): Payer: Self-pay

## 2023-10-15 ENCOUNTER — Other Ambulatory Visit (HOSPITAL_COMMUNITY): Payer: Self-pay

## 2023-10-15 MED ORDER — TAMSULOSIN HCL 0.4 MG PO CAPS
0.4000 mg | ORAL_CAPSULE | Freq: Every day | ORAL | 3 refills | Status: AC
  Filled 2023-10-15: qty 90, 90d supply, fill #0

## 2023-10-18 ENCOUNTER — Other Ambulatory Visit (HOSPITAL_COMMUNITY): Payer: Self-pay

## 2023-11-03 ENCOUNTER — Other Ambulatory Visit (HOSPITAL_COMMUNITY): Payer: Self-pay

## 2023-11-03 MED ORDER — OSELTAMIVIR PHOSPHATE 75 MG PO CAPS
75.0000 mg | ORAL_CAPSULE | Freq: Two times a day (BID) | ORAL | 0 refills | Status: AC
  Filled 2023-11-03: qty 10, 5d supply, fill #0

## 2023-12-08 ENCOUNTER — Other Ambulatory Visit (HOSPITAL_COMMUNITY): Payer: Self-pay

## 2023-12-25 ENCOUNTER — Other Ambulatory Visit (HOSPITAL_COMMUNITY): Payer: Self-pay

## 2023-12-25 MED ORDER — AMLODIPINE BESYLATE 10 MG PO TABS
10.0000 mg | ORAL_TABLET | Freq: Every day | ORAL | 3 refills | Status: AC
  Filled 2023-12-25: qty 90, 90d supply, fill #0

## 2024-01-15 ENCOUNTER — Other Ambulatory Visit (HOSPITAL_COMMUNITY): Payer: Self-pay

## 2024-01-15 MED ORDER — TAMSULOSIN HCL 0.4 MG PO CAPS
0.4000 mg | ORAL_CAPSULE | Freq: Every day | ORAL | 3 refills | Status: AC
  Filled 2024-01-15: qty 90, 90d supply, fill #0
  Filled 2024-07-11: qty 90, 90d supply, fill #1
  Filled 2024-10-06: qty 90, 90d supply, fill #2

## 2024-03-08 ENCOUNTER — Other Ambulatory Visit (HOSPITAL_COMMUNITY): Payer: Self-pay

## 2024-03-08 MED ORDER — HYDROCHLOROTHIAZIDE 12.5 MG PO TABS
12.5000 mg | ORAL_TABLET | Freq: Every day | ORAL | 3 refills | Status: AC
  Filled 2024-03-08: qty 90, 90d supply, fill #0

## 2024-03-30 ENCOUNTER — Other Ambulatory Visit (HOSPITAL_COMMUNITY): Payer: Self-pay

## 2024-03-30 MED ORDER — AMLODIPINE BESYLATE 10 MG PO TABS
10.0000 mg | ORAL_TABLET | Freq: Every day | ORAL | 3 refills | Status: AC
  Filled 2024-03-30: qty 90, 90d supply, fill #0
  Filled 2024-06-24: qty 90, 90d supply, fill #1
  Filled 2024-09-22: qty 90, 90d supply, fill #2

## 2024-05-12 ENCOUNTER — Other Ambulatory Visit (HOSPITAL_COMMUNITY): Payer: Self-pay

## 2024-05-12 MED ORDER — IRBESARTAN 300 MG PO TABS
300.0000 mg | ORAL_TABLET | Freq: Every day | ORAL | 3 refills | Status: AC
  Filled 2024-05-12: qty 90, 90d supply, fill #0
  Filled 2024-08-05: qty 90, 90d supply, fill #1
  Filled 2024-11-08: qty 90, 90d supply, fill #2

## 2024-05-15 ENCOUNTER — Other Ambulatory Visit (HOSPITAL_COMMUNITY): Payer: Self-pay

## 2024-05-31 ENCOUNTER — Other Ambulatory Visit (HOSPITAL_COMMUNITY): Payer: Self-pay

## 2024-05-31 MED ORDER — HYDROCHLOROTHIAZIDE 12.5 MG PO TABS
12.5000 mg | ORAL_TABLET | Freq: Every day | ORAL | 3 refills | Status: AC
  Filled 2024-05-31: qty 90, 90d supply, fill #0
  Filled 2024-08-24: qty 90, 90d supply, fill #1
  Filled 2024-11-22: qty 90, 90d supply, fill #2

## 2024-08-30 ENCOUNTER — Encounter: Payer: Self-pay | Admitting: Adult Health

## 2024-08-30 ENCOUNTER — Ambulatory Visit: Payer: Managed Care, Other (non HMO) | Admitting: Adult Health

## 2024-08-30 VITALS — BP 147/65 | HR 95 | Ht 73.0 in | Wt 264.6 lb

## 2024-08-30 DIAGNOSIS — G4733 Obstructive sleep apnea (adult) (pediatric): Secondary | ICD-10-CM | POA: Diagnosis not present

## 2024-08-30 NOTE — Patient Instructions (Signed)
 Continue using CPAP nightly and greater than 4 hours each night If your symptoms worsen or you develop new symptoms please let us  know.

## 2024-08-30 NOTE — Progress Notes (Signed)
 PATIENT: Robert Mills DOB: 1966-04-25  REASON FOR VISIT: follow up HISTORY FROM: patient PRIMARY NEUROLOGIST: Dr. Buck  Chief Complaint  Patient presents with   Follow-up    Pt in 4 alone Pt here for cpap f/u Pt states increased congestion in the am      HISTORY OF PRESENT ILLNESS: Today 08/30/24:  Robert Mills is a 58 y.o. male with a history of OSA on CPAP. Returns today for follow-up.  Reports that CPAP is working well.  He denies any new issues.  Currently wearing DreamWear fullface.  Sometimes he wakes up with congestion and dry mouth.  He has tried adjusting his humidification but that often creates water in the tubing.  He returns today for an evaluation.        08/27/23: Robert Mills is a 58 y.o. male with a history of OSA on CPAP. Returns today for follow-up.  Reports that since he started CPAP he does feel better.  He is sleeping through the night.  He states that he still has aches and pains that sometimes will wake him up but overall he is feeling great.  His download is below       REVIEW OF SYSTEMS: Out of a complete 14 system review of symptoms, the patient complains only of the following symptoms, and all other reviewed systems are negative.   ESS 7  ALLERGIES: Allergies  Allergen Reactions   Other Swelling    Had a reaction to anesthesia with hernia surgery but cannot recall which drug it was. ?? Propofol  but not sure   Had swelling and redness    HOME MEDICATIONS: Outpatient Medications Prior to Visit  Medication Sig Dispense Refill   amLODipine  (NORVASC ) 10 MG tablet Take 1 tablet (10 mg total) by mouth daily. 90 tablet 3   amLODipine  (NORVASC ) 10 MG tablet Take 1 tablet (10 mg total) by mouth daily as directed. 90 tablet 3   amLODipine  (NORVASC ) 10 MG tablet Take 1 tablet (10 mg total) by mouth daily. 90 tablet 3   amLODipine  (NORVASC ) 10 MG tablet Take 1 tablet (10 mg total) by mouth daily as directed. 90 tablet 3   amLODipine   (NORVASC ) 10 MG tablet Take 1 tablet (10 mg total) by mouth daily. 90 tablet 3   atorvastatin  (LIPITOR) 40 MG tablet Take 1 tablet by mouth daily. 90 tablet 3   DULoxetine  (CYMBALTA ) 30 MG capsule Take 1 capsule (30 mg total) by mouth daily. 30 capsule 5   DULoxetine  (CYMBALTA ) 30 MG capsule Take 1 capsule (30 mg total) by mouth daily. 90 capsule 5   DULoxetine  (CYMBALTA ) 30 MG capsule Take 1 capsule (30 mg total) by mouth daily for 90 days. 90 capsule 3   DULoxetine  (CYMBALTA ) 30 MG capsule Take 1 capsule (30 mg total) by mouth daily. 90 capsule 3   DULoxetine  (CYMBALTA ) 30 MG capsule Take 1 capsule (30 mg total) by mouth daily. 90 capsule 3   hydrochlorothiazide  (HYDRODIURIL ) 12.5 MG tablet Take 1 tablet (12.5 mg total) by mouth daily as directed for 90 days. 90 tablet 3   hydrochlorothiazide  (HYDRODIURIL ) 12.5 MG tablet Take 1 tablet (12.5 mg total) by mouth daily  as directed 90 tablet 3   ibuprofen (ADVIL,MOTRIN) 200 MG tablet Take 600 mg by mouth every 6 (six) hours as needed for headache or moderate pain.     irbesartan  (AVAPRO ) 300 MG tablet Take 1 tablet (300 mg total) by mouth daily. (stop Losartan ) 90 tablet 3  irbesartan  (AVAPRO ) 300 MG tablet Take 1 tablet (300 mg total) by mouth daily. (Stop losartan ) 90 tablet 3   irbesartan  (AVAPRO ) 300 MG tablet Take 1 tablet (300 mg total) by mouth daily. 90 tablet 3   irbesartan  (AVAPRO ) 300 MG tablet Take 1 tablet (300 mg total) by mouth daily. stop losartan  for 90 days 90 tablet 3   ketorolac  (ACULAR ) 0.4 % SOLN Place 1 drop into the left eye 3 (three) times daily. 5 mL 0   loratadine (CLARITIN) 10 MG tablet Take 10 mg by mouth daily as needed for allergies.     nepafenac  (ILEVRO ) 0.3 % ophthalmic suspension Place 1 drop into the left eye daily. 3 mL 0   Omega-3 Fatty Acids (FISH OIL PO) Take 565 mg by mouth. Occasionally     oseltamivir  (TAMIFLU ) 75 MG capsule Take 1 capsule (75 mg total) by mouth 2 (two) times daily for 5 days 10 capsule 0    Probiotic Product (PROBIOTIC PO) Take 1 tablet by mouth every morning. Occasionally     sodium chloride  (OCEAN) 0.65 % SOLN nasal spray Place 1-2 sprays into both nostrils daily as needed for congestion.     SUMAtriptan (IMITREX) 100 MG tablet Take 100 mg by mouth every 2 (two) hours as needed for migraine. May repeat in 2 hours if headache persists or recurs.     tamsulosin  (FLOMAX ) 0.4 MG CAPS capsule Take 1 capsule by mouth daily 90 capsule 3   tamsulosin  (FLOMAX ) 0.4 MG CAPS capsule Take 1 capsule by mouth once daily 90 capsule 1   tamsulosin  (FLOMAX ) 0.4 MG CAPS capsule Take 1 capsule (0.4 mg total) by mouth daily as directed. 90 capsule 3   tamsulosin  (FLOMAX ) 0.4 MG CAPS capsule Take 1 capsule (0.4 mg total) by mouth daily as directed. 90 capsule 3   tamsulosin  (FLOMAX ) 0.4 MG CAPS capsule Take 1 capsule (0.4 mg total) by mouth daily as directed. 90 capsule 3   tamsulosin  (FLOMAX ) 0.4 MG CAPS capsule Take 1 capsule (0.4 mg total) by mouth daily. 90 capsule 3   tamsulosin  (FLOMAX ) 0.4 MG CAPS capsule Take 1 capsule (0.4 mg total) by mouth daily as needed 90 capsule 3   tamsulosin  (FLOMAX ) 0.4 MG CAPS capsule Take 1 capsule (0.4 mg total) by mouth daily. 90 capsule 3   No facility-administered medications prior to visit.    PAST MEDICAL HISTORY: Past Medical History:  Diagnosis Date   Allergy    SEASONAL   GERD (gastroesophageal reflux disease)    Hyperlipidemia    Hypertension    Migraines    Nephrolithiasis    Obesity    Plantar fasciitis    Pneumonia    hx of in childhood    Rosacea     PAST SURGICAL HISTORY: Past Surgical History:  Procedure Laterality Date   compound fx     left leg at age 70   INSERTION OF MESH N/A 09/16/2014   Procedure: INSERTION OF MESH;  Surgeon: Camellia CHRISTELLA Blush, MD;  Location: [facility];  Service: General;  Laterality: N/A;   UMBILICAL HERNIA REPAIR N/A 09/16/2014   Procedure: LAP ASSISTED UMBILICAL HERNIA REPAIR WITH MESH;  Surgeon: Camellia CHRISTELLA Blush, MD;  Location: [facility];  Service: General;  Laterality: N/A;   VASECTOMY     WISDOM TOOTH EXTRACTION     bottom bilat     FAMILY HISTORY: Family History  Problem Relation Age of Onset   Diabetes Mother    Congestive Heart Failure  Mother    Kidney disease Mother    Depression Mother    Hyperlipidemia Father    Diabetes Maternal Grandfather    Heart disease Maternal Grandfather    Colon cancer Other        Great Grandmother   Sleep apnea Neg Hx     SOCIAL HISTORY: Social History   Socioeconomic History   Marital status: Married    Spouse name: Not on file   Number of children: Not on file   Years of education: Not on file   Highest education level: Not on file  Occupational History   Not on file  Tobacco Use   Smoking status: Former    Current packs/day: 0.50    Average packs/day: 0.5 packs/day for 10.0 years (5.0 ttl pk-yrs)    Types: Cigarettes   Smokeless tobacco: Never  Vaping Use   Vaping status: Never Used  Substance and Sexual Activity   Alcohol  use: Yes    Alcohol /week: 10.0 standard drinks of alcohol     Types: 4 Cans of beer, 6 Shots of liquor per week   Drug use: No   Sexual activity: Not on file  Other Topics Concern   Not on file  Social History Narrative   Right handed   Caffeine: 1 cup coffee in the morning usually on the weekends   Social Drivers of Corporate Investment Banker Strain: Not on file  Food Insecurity: Not on file  Transportation Needs: Not on file  Physical Activity: Not on file  Stress: Not on file  Social Connections: Not on file  Intimate Partner Violence: Not on file      PHYSICAL EXAM  Vitals:   08/30/24 1000  BP: (!) 147/65  Pulse: 95  Weight: 264 lb 9.6 oz (120 kg)  Height: 6' 1 (1.854 m)    Body mass index is 34.91 kg/m.  Generalized: Well developed, in no acute distress  Chest: Lungs clear to auscultation bilaterally  Neurological examination  Mentation: Alert oriented to time, place, history  taking. Follows all commands speech and language fluent Cranial nerve II-XII: Extraocular movements were full, visual field were full on confrontational test Head turning and shoulder shrug  were normal and symmetric. Motor: The motor testing reveals 5 over 5 strength of all 4 extremities. Good symmetric motor tone is noted throughout.  Sensory: Sensory testing is intact to soft touch on all 4 extremities. No evidence of extinction is noted.  Gait and station: Gait is normal.    DIAGNOSTIC DATA (LABS, IMAGING, TESTING) - I reviewed patient records, labs, notes, testing and imaging myself where available.  Lab Results  Component Value Date   WBC 7.0 09/12/2014   HGB 14.7 09/12/2014   HCT 41.3 09/12/2014   MCV 91.8 09/12/2014   PLT 187 09/12/2014      Component Value Date/Time   NA 142 09/12/2014 0840   K 4.4 09/12/2014 0840   CL 104 09/12/2014 0840   CO2 27 09/12/2014 0840   GLUCOSE 99 09/12/2014 0840   BUN 16 09/12/2014 0840   CREATININE 0.87 09/12/2014 0840   CALCIUM  9.3 09/12/2014 0840   PROT 7.6 09/12/2014 0840   ALBUMIN 4.0 09/12/2014 0840   AST 35 09/12/2014 0840   ALT 27 09/12/2014 0840   ALKPHOS 85 09/12/2014 0840   BILITOT 0.6 09/12/2014 0840   GFRNONAA >90 09/12/2014 0840   GFRAA >90 09/12/2014 0840      ASSESSMENT AND PLAN 58 y.o. year old male  has a  past medical history of Allergy, GERD (gastroesophageal reflux disease), Hyperlipidemia, Hypertension, Migraines, Nephrolithiasis, Obesity, Plantar fasciitis, Pneumonia, and Rosacea. here with:  OSA on CPAP  - CPAP compliance excellent - Good treatment of AHI  - Encourage patient to use CPAP nightly and > 4 hours each night - F/U in 1 year or sooner if needed   Duwaine Russell, MSN, NP-C 08/30/2024, 9:58 AM [facility] [address] [place], [place] [address] [phone]  The patient's condition requires frequent monitoring and adjustments in the treatment plan, reflecting the  ongoing complexity of care.  This provider is the continuing focal point for all needed services for this condition.

## 2024-09-25 ENCOUNTER — Other Ambulatory Visit (HOSPITAL_COMMUNITY): Payer: Self-pay

## 2024-12-01 ENCOUNTER — Other Ambulatory Visit (HOSPITAL_COMMUNITY): Payer: Self-pay

## 2024-12-01 MED ORDER — OSELTAMIVIR PHOSPHATE 75 MG PO CAPS
75.0000 mg | ORAL_CAPSULE | Freq: Two times a day (BID) | ORAL | 0 refills | Status: AC
  Filled 2024-12-01: qty 10, 5d supply, fill #0

## 2025-09-05 ENCOUNTER — Ambulatory Visit: Admitting: Adult Health
